# Patient Record
Sex: Female | Born: 1970 | Race: White | Hispanic: No | Marital: Married | Smoking: Never smoker
Health system: Southern US, Community
[De-identification: ages and names within clinical notes are randomized; demographics above are authoritative.]

## PROBLEM LIST (undated history)

## (undated) DIAGNOSIS — T7840XA Allergy, unspecified, initial encounter: Secondary | ICD-10-CM

## (undated) DIAGNOSIS — I10 Essential (primary) hypertension: Secondary | ICD-10-CM

## (undated) DIAGNOSIS — K5792 Diverticulitis of intestine, part unspecified, without perforation or abscess without bleeding: Secondary | ICD-10-CM

## (undated) DIAGNOSIS — J45909 Unspecified asthma, uncomplicated: Secondary | ICD-10-CM

## (undated) DIAGNOSIS — E079 Disorder of thyroid, unspecified: Secondary | ICD-10-CM

## (undated) DIAGNOSIS — Z9289 Personal history of other medical treatment: Secondary | ICD-10-CM

## (undated) DIAGNOSIS — Z86718 Personal history of other venous thrombosis and embolism: Secondary | ICD-10-CM

## (undated) HISTORY — DX: Allergy, unspecified, initial encounter: T78.40XA

## (undated) HISTORY — DX: Diverticulitis of intestine, part unspecified, without perforation or abscess without bleeding: K57.92

## (undated) HISTORY — DX: Personal history of other medical treatment: Z92.89

## (undated) HISTORY — DX: Unspecified asthma, uncomplicated: J45.909

## (undated) HISTORY — DX: Disorder of thyroid, unspecified: E07.9

## (undated) HISTORY — DX: Essential (primary) hypertension: I10

## (undated) HISTORY — DX: Personal history of other venous thrombosis and embolism: Z86.718

---

## 2000-11-26 DIAGNOSIS — Z86718 Personal history of other venous thrombosis and embolism: Secondary | ICD-10-CM | POA: Insufficient documentation

## 2000-11-26 HISTORY — DX: Personal history of other venous thrombosis and embolism: Z86.718

## 2015-12-13 DIAGNOSIS — R5383 Other fatigue: Secondary | ICD-10-CM

## 2015-12-13 DIAGNOSIS — J0110 Acute frontal sinusitis, unspecified: Secondary | ICD-10-CM | POA: Insufficient documentation

## 2015-12-13 DIAGNOSIS — R509 Fever, unspecified: Secondary | ICD-10-CM | POA: Insufficient documentation

## 2015-12-13 DIAGNOSIS — R059 Cough, unspecified: Secondary | ICD-10-CM

## 2015-12-13 HISTORY — DX: Other fatigue: R53.83

## 2015-12-13 HISTORY — DX: Cough, unspecified: R05.9

## 2018-07-24 ENCOUNTER — Telehealth: Payer: Self-pay

## 2018-07-24 NOTE — Telephone Encounter (Signed)
Copied from CRM [phone]. Topic: Appointment Scheduling - New Patient >> Jul 24, 2018 11:46 AM Arlyss Gandyichardson, Taren N, NT wrote: New patient has been scheduled for your office. Provider: Dr. Carmelia RollerWendling Date of Appointment: 09/01/18  Route to department's PEC pool.

## 2018-09-01 ENCOUNTER — Ambulatory Visit: Payer: BC Managed Care – PPO | Admitting: Family Medicine

## 2018-09-01 ENCOUNTER — Encounter: Payer: Self-pay | Admitting: Family Medicine

## 2018-09-01 VITALS — BP 134/88 | HR 73 | Temp 98.4°F | Ht 64.0 in | Wt 119.0 lb

## 2018-09-01 DIAGNOSIS — J0100 Acute maxillary sinusitis, unspecified: Secondary | ICD-10-CM | POA: Diagnosis not present

## 2018-09-01 DIAGNOSIS — I10 Essential (primary) hypertension: Secondary | ICD-10-CM | POA: Insufficient documentation

## 2018-09-01 DIAGNOSIS — K769 Liver disease, unspecified: Secondary | ICD-10-CM

## 2018-09-01 DIAGNOSIS — Z86711 Personal history of pulmonary embolism: Secondary | ICD-10-CM

## 2018-09-01 HISTORY — DX: Personal history of pulmonary embolism: Z86.711

## 2018-09-01 HISTORY — DX: Liver disease, unspecified: K76.9

## 2018-09-01 MED ORDER — DOXYCYCLINE HYCLATE 100 MG PO TABS: 100.0000 mg | ORAL_TABLET | Freq: Two times a day (BID) | ORAL | 0 refills | Status: AC

## 2018-09-01 MED ORDER — AMLODIPINE BESYLATE 5 MG PO TABS: 5.0000 mg | ORAL_TABLET | Freq: Every day | ORAL | 3 refills | Status: DC

## 2018-09-01 NOTE — Progress Notes (Signed)
Chief Complaint  Patient presents with  . New Patient (Initial Visit)       New Patient Visit SUBJECTIVE: HPI: Valerie Walker is an 47 y.o.female who is being seen for establishing care.  The patient was previously seen at [facility].   Blood Pressure Patient presents for eval of high BP. Never officially dx'd.  She does monitor home blood pressures. Blood pressures ranging on average from 140-160's/90's. She is not currently on any medication. +famhx of HTN. Feels that she is having HA's and blurred vision that she associated with high BP readings at home. Historically her BP has been in normal range. She is not adhering to a healthy diet. Exercise: none currently  2002 had a PE. Unprovoked, work up neg. Does not take anti-coag or ASA. She is concerned. Heme cleared her from Coumadin.   Was found to have a liver lesion on RUQ in 04/2018. 6 mo f/u noted. She was not explained the results or the concerns and is very anxious. No follow up imaging has been scheduled yet.  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 Levaquin [Levofloxacin In D5w] Palpitations    palpitations and headaches  . Penicillins Anaphylaxis  . Erythromycin Hives    Severe rash and hives  . Aspirin Rash    Past Medical History:  Diagnosis Date  . Allergy   . Asthma   . Diverticulitis   . History of blood transfusion    Had after delivery of baby  . Hx of blood clots 2002   unprovoked  . Hypertension   . Thyroid disease    History reviewed. No pertinent surgical history.   Family History  Problem Relation Age of Onset  . Asthma Mother   . Hypertension Mother   . Kidney disease Mother   . Cancer  Father        prostate  . Hypertension Father   . Hypertension Brother   . Asthma Son   . Asthma Maternal Grandmother   . Heart disease Maternal Grandfather   . Heart attack Maternal Grandfather   . Heart attack Paternal Grandmother   . Heart disease Paternal Grandmother   . Alcohol abuse Paternal Grandfather   . Early death Paternal Grandfather        due to alcohol   Allergies  Allergen Reactions  . Levaquin [Levofloxacin In D5w] Palpitations    palpitations and headaches  . Penicillins Anaphylaxis  . Erythromycin Hives    Severe rash and hives  . Aspirin Rash    Current Outpatient Medications:  .  triamcinolone (KENALOG) 0.025 % cream, Apply 1 application topically 2 (two) times daily., Disp: , Rfl:  .  amLODipine (NORVASC) 5 MG tablet, Take 1 tablet (5 mg total) by mouth daily., Disp: 30 tablet, Rfl: 3 .  aspirin 81 MG tablet, Take 1 tablet (81 mg total) by mouth daily., Disp: 30 tablet, Rfl:  .  doxycycline (VIBRA-TABS) 100 MG tablet, Take 1 tablet (100 mg total) by mouth 2 (two) times daily for 7 days., Disp: 14 tablet, Rfl: 0  ROS 10 pt ROS neg unless otherwise stated in HPI  OBJECTIVE: BP 134/88 (BP Location: Left Arm, Patient Position: Sitting, Cuff Size: Normal)   Pulse 73  Temp 98.4 F (36.9 C) (Oral)   Ht 5\' 4"  (1.626 m)   Wt 119 lb (54 kg)   SpO2 97%   BMI 20.43 kg/m   Constitutional: -  VS reviewed -  Well developed, well nourished, appears stated age -  No apparent distress  Psychiatric: -  Oriented to person, place, and time -  Memory intact -  Affect and mood normal -  Fluent conversation, good eye contact -  Judgment and insight age appropriate  Eye: -  Conjunctivae clear, no discharge -  Pupils symmetric, round, reactive to light  ENMT: -  MMM    Pharynx moist, no exudate, no erythema -  Ears neg -  Nares patent, +max sinus ttp on R  Neck: -  No gross swelling, no palpable masses -  Thyroid midline, not enlarged, mobile, no palpable  masses  Cardiovascular: -  RRR -  No bruits -  No LE edema  Respiratory: -  Normal respiratory effort, no accessory muscle use, no retraction -  Breath sounds equal, no wheezes, no ronchi, no crackles  Gastrointestinal: -  Bowel sounds normal -  No tenderness, no distention, no guarding, no masses  Neurological:  -  CN II - XII grossly intact -  Sensation grossly intact to light touch, equal bilaterally  Musculoskeletal: -  No clubbing, no cyanosis -  Gait normal  Skin: -  No significant lesion on inspection -  Warm and dry to palpation   ASSESSMENT/PLAN: Essential hypertension - Plan: amLODipine (NORVASC) 5 MG tablet  Liver lesion - Plan: US LIVER DOPPLER  Acute maxillary sinusitis, recurrence not specified - Plan: doxycycline (VIBRA-TABS) 100 MG tablet  History of pulmonary embolus (PE) - Plan: aspirin 81 MG tablet  Fortunately, I am able to see her prior records through [facility]. For BP, cont monitoring at home. Counseled on diet and exercise. Start Norvasc. Given famhx, do not suspect 2ndary cause at this time. For liver lesion, will repeat US in 3 mo. Given lack of blood flow, lower concern for malignancy, which I voiced to pt. This had never been discussed with her and she is reassured and agrees to [place] rather than MRI. Pocket rx given for sinusitis. Supportive care and instructions for when to use abx discussed and written. W hx of unprovoked DVT, OK to stay off of anticoag, but recent chest guidelines suggest ASA is next option, so will request her to start taking that. Questionable hx of rash with this, she is to let us know if this happens again. Patient should return in 1 mo to recheck BP. The patient voiced understanding and agreement to the plan.   Jilda Roche [place], DO 09/02/18  12:33 PM

## 2018-09-01 NOTE — Progress Notes (Signed)
Pre visit review using our clinic review tool, if applicable. No additional management support is needed unless otherwise documented below in the visit note. 

## 2018-09-01 NOTE — Patient Instructions (Addendum)
Keep checking blood pressures. Bring your monitor to your next appointment.  Continue to push fluids, practice good hand hygiene, and cover your mouth if you cough.  If you start having fevers, shaking or shortness of breath, seek immediate care.  Keep active.  Start taking a baby aspirin daily.  Most sinus infections are viral in etiology and antibiotics will not be helpful. That being said, if you start having worsening symptoms over 3 days, you are worsening by day 10 or not improving by day 14, go ahead and take it. You are on Day 4 as of now.   DASH Eating Plan DASH stands for "Dietary Approaches to Stop Hypertension." The DASH eating plan is a healthy eating plan that has been shown to reduce high blood pressure (hypertension). It may also reduce your risk for type 2 diabetes, heart disease, and stroke. The DASH eating plan may also help with weight loss. What are tips for following this plan? General guidelines  Avoid eating more than 2,300 mg (milligrams) of salt (sodium) a day. If you have hypertension, you may need to reduce your sodium intake to 1,500 mg a day.  Limit alcohol intake to no more than 1 drink a day for nonpregnant women and 2 drinks a day for men. One drink equals 12 oz of beer, 5 oz of wine, or 1 oz of hard liquor.  Work with your health care provider to maintain a healthy body weight or to lose weight. Ask what an ideal weight is for you.  Get at least 30 minutes of exercise that causes your heart to beat faster (aerobic exercise) most days of the week. Activities may include walking, swimming, or biking.  Work with your health care provider or diet and nutrition specialist (dietitian) to adjust your eating plan to your individual calorie needs. Reading food labels  Check food labels for the amount of sodium per serving. Choose foods with less than 5 percent of the Daily Value of sodium. Generally, foods with less than 300 mg of sodium per serving fit into this  eating plan.  To find whole grains, look for the word "whole" as the first word in the ingredient list. Shopping  Buy products labeled as "low-sodium" or "no salt added."  Buy fresh foods. Avoid canned foods and premade or frozen meals. Cooking  Avoid adding salt when cooking. Use salt-free seasonings or herbs instead of table salt or sea salt. Check with your health care provider or pharmacist before using salt substitutes.  Do not fry foods. Cook foods using healthy methods such as baking, boiling, grilling, and broiling instead.  Cook with heart-healthy oils, such as olive, canola, soybean, or sunflower oil. Meal planning   Eat a balanced diet that includes: ? 5 or more servings of fruits and vegetables each day. At each meal, try to fill half of your plate with fruits and vegetables. ? Up to 6-8 servings of whole grains each day. ? Less than 6 oz of lean meat, poultry, or fish each day. A 3-oz serving of meat is about the same size as a deck of cards. One egg equals 1 oz. ? 2 servings of low-fat dairy each day. ? A serving of nuts, seeds, or beans 5 times each week. ? Heart-healthy fats. Healthy fats called Omega-3 fatty acids are found in foods such as flaxseeds and coldwater fish, like sardines, salmon, and mackerel.  Limit how much you eat of the following: ? Canned or prepackaged foods. ? Food that is  high in trans fat, such as fried foods. ? Food that is high in saturated fat, such as fatty meat. ? Sweets, desserts, sugary drinks, and other foods with added sugar. ? Full-fat dairy products.  Do not salt foods before eating.  Try to eat at least 2 vegetarian meals each week.  Eat more home-cooked food and less restaurant, buffet, and fast food.  When eating at a restaurant, ask that your food be prepared with less salt or no salt, if possible. What foods are recommended? The items listed may not be a complete list. Talk with your dietitian about what dietary choices  are best for you. Grains Whole-grain or whole-wheat bread. Whole-grain or whole-wheat pasta. Brown rice. Orpah Cobb. Bulgur. Whole-grain and low-sodium cereals. Pita bread. Low-fat, low-sodium crackers. Whole-wheat flour tortillas. Vegetables Fresh or frozen vegetables (raw, steamed, roasted, or grilled). Low-sodium or reduced-sodium tomato and vegetable juice. Low-sodium or reduced-sodium tomato sauce and tomato paste. Low-sodium or reduced-sodium canned vegetables. Fruits All fresh, dried, or frozen fruit. Canned fruit in natural juice (without added sugar). Meat and other protein foods Skinless chicken or [place]. Ground chicken or [place]. Pork with fat trimmed off. Fish and seafood. Egg whites. Dried beans, peas, or lentils. Unsalted nuts, nut butters, and seeds. Unsalted canned beans. Lean cuts of beef with fat trimmed off. Low-sodium, lean deli meat. Dairy Low-fat (1%) or fat-free (skim) milk. Fat-free, low-fat, or reduced-fat cheeses. Nonfat, low-sodium ricotta or cottage cheese. Low-fat or nonfat yogurt. Low-fat, low-sodium cheese. Fats and oils Soft margarine without trans fats. Vegetable oil. Low-fat, reduced-fat, or light mayonnaise and salad dressings (reduced-sodium). Canola, safflower, olive, soybean, and sunflower oils. Avocado. Seasoning and other foods Herbs. Spices. Seasoning mixes without salt. Unsalted popcorn and pretzels. Fat-free sweets. What foods are not recommended? The items listed may not be a complete list. Talk with your dietitian about what dietary choices are best for you. Grains Baked goods made with fat, such as croissants, muffins, or some breads. Dry pasta or rice meal packs. Vegetables Creamed or fried vegetables. Vegetables in a cheese sauce. Regular canned vegetables (not low-sodium or reduced-sodium). Regular canned tomato sauce and paste (not low-sodium or reduced-sodium). Regular tomato and vegetable juice (not low-sodium or reduced-sodium). Rosita Fire.  Olives. Fruits Canned fruit in a light or heavy syrup. Fried fruit. Fruit in cream or butter sauce. Meat and other protein foods Fatty cuts of meat. Ribs. Fried meat. Tomasa Blase. Sausage. Bologna and other processed lunch meats. Salami. Fatback. Hotdogs. Bratwurst. Salted nuts and seeds. Canned beans with added salt. Canned or smoked fish. Whole eggs or egg yolks. Chicken or [place] with skin. Dairy Whole or 2% milk, cream, and half-and-half. Whole or full-fat cream cheese. Whole-fat or sweetened yogurt. Full-fat cheese. Nondairy creamers. Whipped toppings. Processed cheese and cheese spreads. Fats and oils Butter. Stick margarine. Lard. Shortening. Ghee. Bacon fat. Tropical oils, such as coconut, palm kernel, or palm oil. Seasoning and other foods Salted popcorn and pretzels. Onion salt, garlic salt, seasoned salt, table salt, and sea salt. Worcestershire sauce. Tartar sauce. Barbecue sauce. Teriyaki sauce. Soy sauce, including reduced-sodium. Steak sauce. Canned and packaged gravies. Fish sauce. Oyster sauce. Cocktail sauce. Horseradish that you find on the shelf. Ketchup. Mustard. Meat flavorings and tenderizers. Bouillon cubes. Hot sauce and Tabasco sauce. Premade or packaged marinades. Premade or packaged taco seasonings. Relishes. Regular salad dressings. Where to find more information:  National Heart, Lung, and Blood Institute: PopSteam.is  American Heart Association: www.heart.org Summary  The DASH eating plan is a healthy eating  plan that has been shown to reduce high blood pressure (hypertension). It may also reduce your risk for type 2 diabetes, heart disease, and stroke.  With the DASH eating plan, you should limit salt (sodium) intake to 2,300 mg a day. If you have hypertension, you may need to reduce your sodium intake to 1,500 mg a day.  When on the DASH eating plan, aim to eat more fresh fruits and vegetables, whole grains, lean proteins, low-fat dairy, and heart-healthy  fats.  Work with your health care provider or diet and nutrition specialist (dietitian) to adjust your eating plan to your individual calorie needs. This information is not intended to replace advice given to you by your health care provider. Make sure you discuss any questions you have with your health care provider. Document Released: 11/01/2011 Document Revised: 11/05/2016 Document Reviewed: 11/05/2016 Elsevier Interactive Patient Education  [employer].

## 2018-10-01 ENCOUNTER — Ambulatory Visit: Payer: BC Managed Care – PPO | Admitting: Family Medicine

## 2018-11-11 ENCOUNTER — Encounter: Payer: Self-pay | Admitting: Family Medicine

## 2018-11-11 ENCOUNTER — Ambulatory Visit: Payer: BC Managed Care – PPO | Admitting: Family Medicine

## 2018-11-11 VITALS — BP 130/86 | HR 77 | Temp 98.2°F | Ht 64.0 in | Wt 120.4 lb

## 2018-11-11 DIAGNOSIS — Z23 Encounter for immunization: Secondary | ICD-10-CM | POA: Diagnosis not present

## 2018-11-11 DIAGNOSIS — I10 Essential (primary) hypertension: Secondary | ICD-10-CM | POA: Diagnosis not present

## 2018-11-11 DIAGNOSIS — Z114 Encounter for screening for human immunodeficiency virus [HIV]: Secondary | ICD-10-CM

## 2018-11-11 NOTE — Progress Notes (Signed)
Chief Complaint  Patient presents with  . Hypertension    Subjective Valerie Walker is a 47 y.o. female who presents for hypertension follow up. She does monitor home blood pressures. Blood pressures ranging from 140's/90's on average. She is compliant with medication- recently started on amlodipine 5 mg/d. Patient has these side effects of medication: none She is adhering to a healthy diet overall. Current exercise: walking, physically active at work   Past Medical History:  Diagnosis Date  . Allergy   . Asthma   . Diverticulitis   . History of blood transfusion    Had after delivery of baby  . Hx of blood clots 2002   unprovoked  . Hypertension   . Thyroid disease     Review of Systems Cardiovascular: no chest pain Respiratory:  no shortness of breath  Exam BP 130/86 (BP Location: Left Arm, Patient Position: Sitting, Cuff Size: Normal)   Pulse 77   Temp 98.2 F (36.8 C) (Oral)   Ht 5\' 4"  (1.626 m)   Wt 120 lb 6 oz (54.6 kg)   SpO2 98%   BMI 20.66 kg/m  General:  well developed, well nourished, in no apparent distress Heart: RRR, no bruits, no LE edema Lungs: clear to auscultation, no accessory muscle use Psych: well oriented with normal range of affect and appropriate judgment/insight  Hypertension, unspecified type - Plan: TSH, Comprehensive metabolic panel, CBC, Aldosterone + renin activity w/ ratio  Screening for HIV (human immunodeficiency virus) - Plan: HIV Antibody (routine testing w rflx)  Orders as above. Ck labs. Will hold off on adjusting dose until we can verify home readings.  Counseled on diet and exercise. Cut down on sweets.  F/u in 1 mo. The patient voiced understanding and agreement to the plan.  Jilda Rocheicholas Paul Westlake VillageWend[place], DO 11/11/18  4:30 PM

## 2018-11-11 NOTE — Patient Instructions (Addendum)
Keep checking blood pressures at home.   Keep the diet clean and stay active.  Give us 4-5 business days to get the results of your labs back.   Let's keep the medicine the same dose for now. Remember to bring your blood pressure monitor to your next appointment.   Let us know if you need anything.

## 2018-11-11 NOTE — Addendum Note (Signed)
Addended by: Scharlene GlossEWING, ROBIN B on: 11/11/2018 04:37 PM   Modules accepted: Orders

## 2018-11-11 NOTE — Progress Notes (Signed)
Pre visit review using our clinic review tool, if applicable. No additional management support is needed unless otherwise documented below in the visit note. 

## 2018-11-12 LAB — COMPREHENSIVE METABOLIC PANEL
ALBUMIN: 4.3 g/dL (ref 3.5–5.2)
ALK PHOS: 71 U/L (ref 39–117)
ALT: 15 U/L (ref 0–35)
AST: 16 U/L (ref 0–37)
BILIRUBIN TOTAL: 0.3 mg/dL (ref 0.2–1.2)
BUN: 11 mg/dL (ref 6–23)
CALCIUM: 9.6 mg/dL (ref 8.4–10.5)
CO2: 29 mEq/L (ref 19–32)
Chloride: 101 mEq/L (ref 96–112)
Creatinine, Ser: 0.73 mg/dL (ref 0.40–1.20)
GFR: 90.87 mL/min (ref >60.00)
Glucose, Bld: 99 mg/dL (ref 70–99)
Potassium: 4.7 mEq/L (ref 3.5–5.1)
Sodium: 137 mEq/L (ref 135–145)
TOTAL PROTEIN: 7.1 g/dL (ref 6.0–8.3)

## 2018-11-12 LAB — CBC
HCT: 41.2 % (ref 36.0–46.0)
Hemoglobin: 13.6 g/dL (ref 12.0–15.0)
MCHC: 33 g/dL (ref 30.0–36.0)
MCV: 90.5 fl (ref 78.0–100.0)
PLATELETS: 392 10*3/uL (ref 150.0–400.0)
RBC: 4.56 Mil/uL (ref 3.87–5.11)
RDW: 12.9 % (ref 11.5–15.5)
WBC: 10 10*3/uL (ref 4.0–10.5)

## 2018-11-12 LAB — TSH: TSH: 0.64 u[IU]/mL (ref 0.35–4.50)

## 2018-11-13 ENCOUNTER — Telehealth: Payer: Self-pay

## 2018-11-13 DIAGNOSIS — K769 Liver disease, unspecified: Secondary | ICD-10-CM

## 2018-11-13 NOTE — Telephone Encounter (Signed)
"  2. 2 well-defined hyperechoic lesions within the right lobe of the liver. These most likely represent hemangiomas. Follow-up ultrasound or MRI 6 months would be useful for further evaluation." Because hemangiomas were possibly involved I thought doppler, but I definitely want to evaluate lesions so change if necessary. TY.

## 2018-11-13 NOTE — Telephone Encounter (Signed)
[facility] calling to clarify order for US liver per Dr. Carmelia RollerWendling, scheduled for tomorrow.  Imaging questioning need for a doppler of the lesion, or just to evaluate liver lesion itself. If latter, RUQ U/S is more appropriate. Routed to Dr .Carmelia RollerWendling as high priority to address. Need to call #[phone], "Peggy", by end of the day to clarify.

## 2018-11-14 ENCOUNTER — Ambulatory Visit (HOSPITAL_COMMUNITY)
Admission: RE | Admit: 2018-11-14 | Discharge: 2018-11-14 | Disposition: A | Discharge status: Home / Self Care | Payer: BC Managed Care – PPO | Source: Ambulatory Visit | Attending: Family Medicine | Admitting: Family Medicine

## 2018-11-14 DIAGNOSIS — K769 Liver disease, unspecified: Secondary | ICD-10-CM | POA: Insufficient documentation

## 2018-11-17 LAB — HIV ANTIBODY (ROUTINE TESTING W REFLEX): HIV: NONREACTIVE

## 2018-11-17 LAB — ALDOSTERONE + RENIN ACTIVITY W/ RATIO
ALDO / PRA Ratio: 1.6 Ratio (ref 0.9–28.9)
ALDOSTERONE: 2 ng/dL
Renin Activity: 1.25 ng/mL/h (ref 0.25–5.82)

## 2018-12-10 ENCOUNTER — Ambulatory Visit (INDEPENDENT_AMBULATORY_CARE_PROVIDER_SITE_OTHER): Payer: BC Managed Care – PPO | Admitting: Family Medicine

## 2018-12-10 DIAGNOSIS — I10 Essential (primary) hypertension: Secondary | ICD-10-CM | POA: Diagnosis not present

## 2018-12-10 MED ORDER — AMLODIPINE BESYLATE 10 MG PO TABS: 10.0000 mg | ORAL_TABLET | Freq: Every day | ORAL | 3 refills | Status: DC

## 2018-12-10 NOTE — Progress Notes (Signed)
Pre visit review using our clinic tool,if applicable. No additional management support is needed unless otherwise documented below in the visit note.   Pt here for Blood pressure check per order from Dr. Zipporah Plants.  Pt currently takes:Amlodipine 5 mg daily.  Home monitor readings have been as follows: 128/91,156/93,129/85.  Pt reports compliance with medication.  BP today  = 154/90 HR =70  Home Monitor BP= 165/110  P= 72  Pt advised per Dr. Carmelia Roller increase Amlodipine to 10 mg daily and return in 2 weeks for BP check. Appointment scheduled.

## 2018-12-10 NOTE — Progress Notes (Signed)
Noted. Agree with above.  Jadrien Narine Paul Derryl Uher, DO 12/10/18 5:01 PM   

## 2018-12-17 ENCOUNTER — Encounter: Payer: Self-pay | Admitting: Family Medicine

## 2018-12-17 ENCOUNTER — Ambulatory Visit: Payer: BC Managed Care – PPO | Admitting: Family Medicine

## 2018-12-17 VITALS — BP 122/82 | HR 79 | Temp 98.5°F | Ht 64.0 in | Wt 119.4 lb

## 2018-12-17 DIAGNOSIS — H6593 Unspecified nonsuppurative otitis media, bilateral: Secondary | ICD-10-CM

## 2018-12-17 DIAGNOSIS — B9789 Other viral agents as the cause of diseases classified elsewhere: Secondary | ICD-10-CM

## 2018-12-17 DIAGNOSIS — J069 Acute upper respiratory infection, unspecified: Secondary | ICD-10-CM

## 2018-12-17 MED ORDER — METHYLPREDNISOLONE ACETATE 80 MG/ML IJ SUSP
80.0000 mg | Freq: Once | INTRAMUSCULAR | Status: AC
  Administered 2018-12-17: 80 mg via INTRAMUSCULAR

## 2018-12-17 NOTE — Patient Instructions (Addendum)
Continue to push fluids, practice good hand hygiene, and cover your mouth if you cough.  If you start having fevers, shaking or shortness of breath, seek immediate care.  For symptoms, consider using Vick's VapoRub on chest or under nose, air humidifier, Benadryl at night, and elevating the head of the bed. Tylenol and ibuprofen for aches and pains you may be experiencing.   Mucinex (guanfesin) otc can help with breaking up the secretions.  Let us know if you need anything.

## 2018-12-17 NOTE — Addendum Note (Signed)
Addended by: Scharlene Gloss B on: 12/17/2018 03:35 PM   Modules accepted: Orders

## 2018-12-17 NOTE — Progress Notes (Signed)
Chief Complaint  Patient presents with  . Cough    congestion    Estill Cotta here for URI complaints.  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 Allergy   . Asthma   . Diverticulitis   . History of blood transfusion    Had after delivery of baby  . Hx of blood clots 2002   unprovoked  . Hypertension   . Thyroid disease     BP 122/82 (BP Location: Left Arm, Patient Position: Sitting, Cuff Size: Normal)   Pulse 79   Temp 98.5 F (36.9 C) (Oral)   Ht 5\' 4"  (1.626 m)   Wt 119 lb 6 oz (54.1 kg)   SpO2 98%   BMI 20.49 kg/m  General: Awake, alert, appears stated age HEENT: AT, [place], ears patent b/l and TM on R neg, L TM with serous fluid without erythema, nares patent w/o discharge, pharynx pink and without exudates, MMM Neck: No masses or asymmetry Heart: RRR Lungs: CTAB, no accessory muscle use Psych: Age appropriate judgment and insight, normal mood and affect  Viral URI with cough  Middle ear effusion, bilateral  Mucinex to help break things up.  Continue to push fluids, practice good hand hygiene, cover mouth when coughing. F/u prn. If starting to experience fevers, shaking, or shortness of breath, seek immediate care. Pt voiced understanding and agreement to the plan.  Jilda Roche [place], DO 12/17/18 3:28 PM

## 2018-12-19 ENCOUNTER — Encounter: Payer: Self-pay | Admitting: Family Medicine

## 2018-12-23 ENCOUNTER — Other Ambulatory Visit: Payer: Self-pay | Admitting: Family Medicine

## 2018-12-23 MED ORDER — AMLODIPINE BESYLATE 5 MG PO TABS: 5.0000 mg | ORAL_TABLET | Freq: Every day | ORAL | 1 refills | Status: DC

## 2018-12-23 NOTE — Progress Notes (Signed)
Changed Norvasc from 10 mg/d to 5 mg/d. Pt notified via [facility].

## 2018-12-24 ENCOUNTER — Ambulatory Visit: Payer: BC Managed Care – PPO

## 2018-12-24 ENCOUNTER — Telehealth: Payer: Self-pay

## 2018-12-24 NOTE — Telephone Encounter (Signed)
Pt scheduled for BP check today- however PCP changed amlodipine back to 5mg  daily on 12/19/2018. Per PCP- cancel nurse visit- reschedule for 4 weeks. LMOM informing Pt of this information and to call and reschedule nurse visit. BP check for today cancelled.

## 2019-01-19 ENCOUNTER — Ambulatory Visit: Payer: BC Managed Care – PPO | Admitting: Family Medicine

## 2019-01-19 ENCOUNTER — Encounter: Payer: Self-pay | Admitting: Family Medicine

## 2019-01-19 VITALS — BP 122/80 | HR 105 | Temp 98.9°F | Ht 64.5 in | Wt 120.0 lb

## 2019-01-19 DIAGNOSIS — J0100 Acute maxillary sinusitis, unspecified: Secondary | ICD-10-CM | POA: Diagnosis not present

## 2019-01-19 DIAGNOSIS — M7918 Myalgia, other site: Secondary | ICD-10-CM

## 2019-01-19 MED ORDER — METHYLPREDNISOLONE ACETATE 80 MG/ML IJ SUSP
80.0000 mg | Freq: Once | INTRAMUSCULAR | Status: AC
  Administered 2019-01-19: 80 mg via INTRAMUSCULAR

## 2019-01-19 MED ORDER — DOXYCYCLINE HYCLATE 100 MG PO TABS: 100.0000 mg | ORAL_TABLET | Freq: Two times a day (BID) | ORAL | 0 refills | Status: DC

## 2019-01-19 MED ORDER — AMLODIPINE BESYLATE 5 MG PO TABS: 5.0000 mg | ORAL_TABLET | Freq: Every day | ORAL | 1 refills | Status: DC

## 2019-01-19 NOTE — Progress Notes (Signed)
Chief Complaint  Patient presents with  . Cough    congestion  . Fever    Valerie Walker here for URI complaints.  Duration: 4 days  Associated symptoms: fevers (101F), sinus congestion, sinus pain, rhinorrhea, ear pain, wheezing, chest tightness and cough Denies: itchy watery eyes, ear drainage, sore throat, shortness of breath and myalgia Treatment to date: Mucinex Sick contacts: Yes- at school  Has issue on R buttock from many years ago. Starting to get more painful, notices when she stands for periods of time. No inj or recent change in activity. Has not tried anything.   ROS:  Const: + fevers HEENT: As noted in HPI Lungs: No SOB  Past Medical History:  Diagnosis Date  . Allergy   . Asthma   . Diverticulitis   . History of blood transfusion    Had after delivery of baby  . Hx of blood clots 2002   unprovoked  . Hypertension   . Thyroid disease     BP 122/80 (BP Location: Left Arm, Patient Position: Sitting, Cuff Size: Normal)   Pulse (!) 105   Temp 98.9 F (37.2 C) (Oral)   Ht 5' 4.5" (1.638 m)   Wt 120 lb (54.4 kg)   SpO2 98%   BMI 20.28 kg/m  General: Awake, alert, appears stated age HEENT: AT, [place], ears patent b/l and TM's neg, nares patent w/o discharge, +ttp over max sinuses b/l, pharynx pink and without exudates, MMM Neck: No masses or asymmetry Heart: RRR Lungs: CTAB, no accessory muscle use MSK: +ttp over cephalad portion of R glute area, there is a depression of hypopigmentation over the area. No fluctuance, excessive warmth, erythema Psych: Age appropriate judgment and insight, normal mood and affect  Acute maxillary sinusitis, recurrence not specified - Plan: doxycycline (VIBRA-TABS) 100 MG tablet  Buttock pain - Plan: Ambulatory referral to Sports Medicine  IM Depo today. If no improvement or fevers return, take above abx.  Continue to push fluids, practice good hand hygiene, cover mouth when coughing. Refer to [facility] for possible [place] of above.  Stretches/exercises provided.  F/u prn. If starting to experience fevers, shaking, or shortness of breath, seek immediate care. Pt voiced understanding and agreement to the plan.  Jilda Roche [place], DO 01/19/19 4:03 PM

## 2019-01-19 NOTE — Addendum Note (Signed)
Addended by: Scharlene Gloss B on: 01/19/2019 04:13 PM   Modules accepted: Orders

## 2019-01-19 NOTE — Patient Instructions (Addendum)
Continue to push fluids, practice good hand hygiene, and cover your mouth if you cough.  If you start having fevers, shaking or shortness of breath, seek immediate care.  If you develop fevers again, take antibiotic. If you continue to improve, you do not need to take.  If you do not hear anything about your referral in the next 1-2 weeks, call our office and ask for an update.  Let us know if you need anything.   Hip Exercises It is normal to feel mild stretching, pulling, tightness, or discomfort as you do these exercises, but you should stop right away if you feel sudden pain or your pain gets worse.  STRETCHING AND RANGE OF MOTION EXERCISES These exercises warm up your muscles and joints and improve the movement and flexibility of your hip. These exercises also help to relieve pain, numbness, and tingling. Exercise A: Hamstrings, Supine  1. Lie on your back. 2. Loop a belt or towel over the ball of your left / rightfoot. The ball of your foot is on the walking surface, right under your toes. 3. Straighten your left / rightknee and slowly pull on the belt to raise your leg. ? Do not let your left / right knee bend while you do this. ? Keep your other leg flat on the floor. ? Raise the left / right leg until you feel a gentle stretch behind your left / right knee or thigh. 4. Hold this position for 30 seconds. 5. Slowly return your leg to the starting position. Repeat2 times. Complete this stretch 3 times per week. Exercise B: Hip Rotators  1. Lie on your back on a firm surface. 2. Hold your left / right knee with your left / right hand. Hold your ankle with your other hand. 3. Gently pull your left / right knee and rotate your lower leg toward your other shoulder. ? Pull until you feel a stretch in your buttocks. ? Keep your hips and shoulders firmly planted while you do this stretch. 4. Hold this position for 30 seconds. Repeat 2 times. Complete this stretch 3 times per  week. Exercise C: V-Sit (Hamstrings and Adductors)  1. Sit on the floor with your legs extended in a large "V" shape. Keep your knees straight during this exercise. 2. Start with your head and chest upright, then bend at your waist to reach for your left foot (position A). You should feel a stretch in your right inner thigh. 3. Hold this position for 30 seconds. Then slowly return to the upright position. 4. Bend at your waist to reach forward (position B). You should feel a stretch behind both of your thighs and knees. 5. Hold this position for 30 seconds. Then slowly return to the upright position. 6. Bend at your waist to reach for your right foot (position C). You should feel a stretch in your left inner thigh. 7. Hold this position for 30 seconds. Then slowly return to the upright position. Repeat A, B, and C 2 times each. Complete this stretch 3 times per week. Exercise D: Lunge (Hip Flexors)  1. Place your left / right knee on the floor and bend your other knee so that is directly over your ankle. You should be half-kneeling. 2. Keep good posture with your head over your shoulders. 3. Tighten your buttocks to point your tailbone downward. This helps your back to keep from arching too much. 4. You should feel a gentle stretch in the front of your left /  right thigh and hip. If you do not feel any resistance, slightly slide your other foot forward and then slowly lunge forward so your knee once again lines up over your ankle. 5. Make sure your tailbone continues to point downward. 6. Hold this position for 30 seconds. Repeat 2 times. Complete this stretch 3 times per week.  STRENGTHENING EXERCISES These exercises build strength and endurance in your hip. Endurance is the ability to use your muscles for a long time, even after they get tired. Exercise E: Bridge (Hip Extensors)  1. Lie on your back on a firm surface with your knees bent and your feet flat on the floor. 2. Tighten your  buttocks muscles and lift your bottom off the floor until the trunk of your body is level with your thighs. ? Do not arch your back. ? You should feel the muscles working in your buttocks and the back of your thighs. If you do not feel these muscles, slide your feet 1-2 inches (2.5-5 cm) farther away from your buttocks. 3. Hold this position for 3 seconds. 4. Slowly lower your hips to the starting position. Repeat for a total of 10 repetitions. 5. Let your muscles relax completely between repetitions. 6. If this exercise is too easy, try doing it with your arms crossed over your chest. Repeat 2 times. Complete this exercise 3 times per week. Exercise F: Straight Leg Raises - Hip Abductors  1. Lie on your side with your left / right leg in the top position. Lie so your head, shoulder, knee, and hip line up with each other. You may bend your bottom knee to help you balance. 2. Roll your hips slightly forward, so your hips are stacked directly over each other and your left / right knee is facing forward. 3. Leading with your heel, lift your top leg 4-6 inches (10-15 cm). You should feel the muscles in your outer hip lifting. ? Do not let your foot drift forward. ? Do not let your knee roll toward the ceiling. 4. Hold this position for 1 second. 5. Slowly return to the starting position. 6. Let your muscles relax completely between repetitions. Repeat for a total of 10 repetitions.  Repeat 2 times. Complete this exercise 3 times per week. Exercise G: Straight Leg Raises - Hip Adductors  1. Lie on your side with your left / right leg in the bottom position. Lie so your head, shoulder, knee, and hip line up. You may place your upper foot in front to help you balance. 2. Roll your hips slightly forward, so your hips are stacked directly over each other and your left / right knee is facing forward. 3. Tense the muscles in your inner thigh and lift your bottom leg 4-6 inches (10-15 cm). 4. Hold this  position for 1 second. 5. Slowly return to the starting position. 6. Let your muscles relax completely between repetitions. Repeat for a total of 10 repetitions. Repeat 2 times. Complete this exercise 3 times per week. Exercise H: Straight Leg Raises - Quadriceps  1. Lie on your back with your left / right leg extended and your other knee bent. 2. Tense the muscles in the front of your left / right thigh. When you do this, you should see your kneecap slide up or see increased dimpling just above your knee. 3. Tighten these muscles even more and raise your leg 4-6 inches (10-15 cm) off the floor. 4. Hold this position for 3 seconds. 5. Keep these muscles  tense as you lower your leg. 6. Relax the muscles slowly and completely between repetitions. Repeat for a total of 10 repetitions. Repeat 2 times. Complete this exercise 3 times per week. Exercise I: Hip Abductors, Standing 1. Tie one end of a rubber exercise band or tubing to a secure surface, such as a table or pole. 2. Loop the other end of the band or tubing around your left / right ankle. 3. Keeping your ankle with the band or tubing directly opposite of the secured end, step away until there is tension in the tubing or band. Hold onto a chair as needed for balance. 4. Lift your left / right leg out to your side. While you do this: ? Keep your back upright. ? Keep your shoulders over your hips. ? Keep your toes pointing forward. ? Make sure to use your hip muscles to lift your leg. Do not "throw" your leg or tip your body to lift your leg. 5. Hold this position for 1 second. 6. Slowly return to the starting position. Repeat for a total of 10 repetitions. Repeat 2 times. Complete this exercise 3 times per week. Exercise J: Squats (Quadriceps) 1. Stand in a door frame so your feet and knees are in line with the frame. You may place your hands on the frame for balance. 2. Slowly bend your knees and lower your hips like you are going to  sit in a chair. ? Keep your lower legs in a straight-up-and-down position. ? Do not let your hips go lower than your knees. ? Do not bend your knees lower than told by your health care provider. ? If your hip pain increases, do not bend as low. 3. Hold this position for 1 second. 4. Slowly push with your legs to return to standing. Do not use your hands to pull yourself to standing. Repeat for a total of 10 repetitions. Repeat 2 times. Complete this exercise 3 times per week. Make sure you discuss any questions you have with your health care provider. Document Released: 11/30/2005 Document Revised: 08/06/2016 Document Reviewed: 11/07/2015 Elsevier Interactive Patient Education  [employer].

## 2019-08-19 ENCOUNTER — Other Ambulatory Visit: Payer: Self-pay

## 2019-08-19 ENCOUNTER — Encounter: Payer: Self-pay | Admitting: Family Medicine

## 2019-08-19 ENCOUNTER — Ambulatory Visit: Payer: BC Managed Care – PPO | Admitting: Family Medicine

## 2019-08-19 VITALS — BP 118/80 | HR 84 | Temp 98.2°F | Ht 64.0 in | Wt 129.5 lb

## 2019-08-19 DIAGNOSIS — M2550 Pain in unspecified joint: Secondary | ICD-10-CM | POA: Diagnosis not present

## 2019-08-19 DIAGNOSIS — R635 Abnormal weight gain: Secondary | ICD-10-CM

## 2019-08-19 NOTE — Progress Notes (Signed)
Chief Complaint  Patient presents with  . Joint Swelling    pain in joints    Subjective: Patient is a 48 y.o. female here for joint pain.  Started having pain in her joints around 2 months ago.  It affects mainly her hands, knees, and ankles bilaterally.  She is also having diffuse itching but no current rash.  Today, her pain and swelling is better than usual.  She denies any injury or inciting event.  No recent change in her medications.  She does not have a personal history of gout, but does have a family history of rheumatoid arthritis and lupus in some second-degree relatives.  She has been gaining weight as well.  Denies any dry mouth, neurologic s/s's, or vision changes.  ROS: Endo: +wt gain MSK: +jt pain  Past Medical History:  Diagnosis Date  . Allergy   . Asthma   . Diverticulitis   . History of blood transfusion    Had after delivery of baby  . Hx of blood clots 2002   unprovoked  . Hypertension   . Thyroid disease     Objective: BP 118/80 (BP Location: Left Arm, Patient Position: Sitting, Cuff Size: Normal)   Pulse 84   Temp 98.2 F (36.8 C) (Temporal)   Ht 5\' 4"  (1.626 m)   Wt 129 lb 8 oz (58.7 kg)   SpO2 97%   BMI 22.23 kg/m  General: Awake, appears stated age HEENT: MMM, EOMi Heart: RRR, no lower extremity edema MSK:+ttp over right PIP joint without edema; I do not appreciate any other areas of deformity, tenderness to palpation, or joint swelling including the knees or ankles Neuro: DTRs equal and symmetric in lower extremities, no cerebellar signs, no clonus Lungs: CTAB, no rales, wheezes or rhonchi. No accessory muscle use Psych: Age appropriate judgment and insight, normal affect and mood  Assessment and Plan: Arthralgia, unspecified joint - Plan: CBC, Uric acid, Vitamin D (25 hydroxy), Antinuclear Antib (ANA), Rheumatoid Factor, Centromere Antibodies, Anti-Smith antibody, Anti-DNA antibody, double-stranded, Aldolase, Cyclic citrul peptide antibody,  IgG (QUEST), Sjogrens syndrome-A extractable nuclear antibody  Weight gain - Plan: TSH  Orders as above.  Will rule out autoimmune etiologies.  If above is normal, will trial a prednisone taper.  If that is particularly helpful, will refer to rheumatology. Follow-up as needed regarding this.  I want to see her in person if she is having swollen joints.  We may be able to aspirate and send off for fluid analysis. The patient voiced understanding and agreement to the plan.  Hollins, DO 08/19/19  4:20 PM

## 2019-08-19 NOTE — Patient Instructions (Signed)
Consider taking ibuprofen.  OK to take Tylenol 1000 mg (2 extra strength tabs) or 975 mg (3 regular strength tabs) every 6 hours as needed.  Ice/cold pack over area for 10-15 min twice daily.  Give [place] 1-2 weeks to get the results of your labs back.   I want to see you if the swelling comes back.   Let us know if you need anything.

## 2019-08-20 LAB — URIC ACID: Uric Acid, Serum: 4.1 mg/dL (ref 2.4–7.0)

## 2019-08-20 LAB — CBC
HCT: 41.3 % (ref 36.0–46.0)
Hemoglobin: 13.7 g/dL (ref 12.0–15.0)
MCHC: 33.1 g/dL (ref 30.0–36.0)
MCV: 89.2 fl (ref 78.0–100.0)
Platelets: 383 10*3/uL (ref 150.0–400.0)
RBC: 4.63 Mil/uL (ref 3.87–5.11)
RDW: 12.7 % (ref 11.5–15.5)
WBC: 8.5 10*3/uL (ref 4.0–10.5)

## 2019-08-20 LAB — VITAMIN D 25 HYDROXY (VIT D DEFICIENCY, FRACTURES): VITD: 57.6 ng/mL (ref 30.00–100.00)

## 2019-08-20 LAB — TSH: TSH: 0.67 u[IU]/mL (ref 0.35–4.50)

## 2019-08-21 LAB — CENTROMERE ANTIBODIES: Centromere Ab Screen: <1 AI

## 2019-08-21 LAB — CYCLIC CITRUL PEPTIDE ANTIBODY, IGG: Cyclic Citrullin Peptide Ab: <16 UNITS

## 2019-08-21 LAB — RHEUMATOID FACTOR: Rheumatoid fact SerPl-aCnc: <14 IU/mL (ref <14)

## 2019-08-21 LAB — ALDOLASE: Aldolase: 5.2 U/L (ref <=8.1)

## 2019-08-21 LAB — ANTI-DNA ANTIBODY, DOUBLE-STRANDED: ds DNA Ab: 1 IU/mL

## 2019-08-21 LAB — SJOGRENS SYNDROME-A EXTRACTABLE NUCLEAR ANTIBODY: SSA (Ro) (ENA) Antibody, IgG: <1 AI

## 2019-08-21 LAB — ANA: Anti Nuclear Antibody (ANA): NEGATIVE

## 2019-08-21 LAB — ANTI-SMITH ANTIBODY: ENA SM Ab Ser-aCnc: <1 AI

## 2019-08-22 ENCOUNTER — Other Ambulatory Visit: Payer: Self-pay | Admitting: Family Medicine

## 2019-08-22 MED ORDER — PREDNISONE 10 MG PO TABS: ORAL_TABLET | ORAL | 0 refills | Status: AC

## 2019-12-01 ENCOUNTER — Other Ambulatory Visit: Payer: Self-pay | Admitting: Family Medicine

## 2020-06-05 ENCOUNTER — Other Ambulatory Visit: Payer: Self-pay | Admitting: Family Medicine

## 2020-07-28 ENCOUNTER — Telehealth (INDEPENDENT_AMBULATORY_CARE_PROVIDER_SITE_OTHER): Payer: BC Managed Care – PPO | Admitting: Family Medicine

## 2020-07-28 DIAGNOSIS — R0981 Nasal congestion: Secondary | ICD-10-CM

## 2020-07-28 DIAGNOSIS — J029 Acute pharyngitis, unspecified: Secondary | ICD-10-CM | POA: Diagnosis not present

## 2020-07-28 DIAGNOSIS — R5383 Other fatigue: Secondary | ICD-10-CM | POA: Diagnosis not present

## 2020-07-28 NOTE — Patient Instructions (Addendum)
  WORK SLIP:  Patient Valerie Walker,  12-24-1970, was seen for a medical visit today, 07/28/20. Please excuse from work according to the [employer] guidelines for a COVID like illness. We advise 10 days minimum from the onset of symptoms (07/27/20) PLUS 1 day of no fever and improved symptoms. Will defer to employer for a sooner return to work if COVID19 testing is negative and the symptoms have resolved. Advise following CDC guidelines.   Sincerely: E-signature: Dr. Kriste Basque, DO [facility] Ph: [phone]

## 2020-07-28 NOTE — Progress Notes (Signed)
Virtual Visit via Video Note  I connected with Valerie Walker  on 07/28/20 at  3:20 PM EDT by a video enabled telemedicine application and verified that I am speaking with the correct person using two identifiers.  Location patient: home, [place] Location provider:work or home office Persons participating in the virtual visit: patient, provider  I discussed the limitations of evaluation and management by telemedicine and the availability of in person appointments. The patient expressed understanding and agreed to proceed.   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  Past Medical History:  Diagnosis Date  . Allergy   . Asthma   . Diverticulitis   . History of blood transfusion    Had after delivery of baby  . Hx of blood clots 2002   unprovoked  . Hypertension   . Thyroid disease     No past surgical history on file.  Family History  Problem Relation Age of Onset  . Asthma Mother   . Hypertension Mother   . Kidney disease Mother   . Cancer Father        prostate  . Hypertension Father   . Hypertension Brother   . Asthma Son   . Asthma Maternal Grandmother   . Heart disease Maternal Grandfather   . Heart attack Maternal Grandfather   . Heart attack Paternal Grandmother   . Heart disease Paternal Grandmother   . Alcohol abuse Paternal Grandfather   . Early death Paternal Grandfather        due to alcohol    SOCIAL HX: see hpi   Current Outpatient Medications:  .   amLODipine (NORVASC) 5 MG tablet, TAKE 1 TABLET BY MOUTH EVERY DAY, Disp: 90 tablet, Rfl: 1 .  triamcinolone (KENALOG) 0.025 % cream, Apply 1 application topically 2 (two) times daily., Disp: , Rfl:   EXAM:  VITALS per patient if applicable:  GENERAL: alert, oriented, appears well and in no acute distress  HEENT: atraumatic, conjunttiva clear, no obvious abnormalities on inspection of external nose and ears  NECK: normal movements of the head and neck  LUNGS: on inspection no signs of respiratory distress, breathing rate appears normal, no obvious gross SOB, gasping or wheezing  CV: no obvious cyanosis  MS: moves all visible extremities without noticeable abnormality  PSYCH/NEURO: pleasant and cooperative, no obvious depression or anxiety, speech and thought processing grossly intact  ASSESSMENT AND PLAN:  Discussed the following assessment and plan:  Sore throat  Sinus congestion  Other fatigue  -we discussed possible serious and likely etiologies, options for evaluation and workup, limitations of telemedicine visit vs in person visit, treatment, treatment risks and precautions. Pt prefers to treat via telemedicine empirically rather then risking or undertaking an in person visit at this moment. Query VURI, COVID19 vs other. Discussed sinusitis, but seems less likely given short duration of symptoms and she has a number of abx intolerances. Opted for symptomatic care with nasal saline, prn analgesic, short course afrin and advised COVID19 testing and isolation while sick  and per guidelines if positive. Provided her with the information for the COVID19 testing sites through [facility], treatment options, isolation, precautions. Work/School slipped offered:in patient instructions Advised to seek prompt follow up telemedicine visit or in person care if worsening, new symptoms arise, or if is not improving with treatment.    I discussed the assessment and treatment plan with the  patient. The patient was provided an opportunity to ask questions and all were answered. The patient agreed with the plan and demonstrated an understanding of the instructions.    Terressa Koyanagi, DO

## 2020-09-30 ENCOUNTER — Ambulatory Visit: Payer: BC Managed Care – PPO | Admitting: Family Medicine

## 2020-10-24 ENCOUNTER — Telehealth (INDEPENDENT_AMBULATORY_CARE_PROVIDER_SITE_OTHER): Payer: BC Managed Care – PPO | Admitting: Internal Medicine

## 2020-10-24 ENCOUNTER — Other Ambulatory Visit: Payer: Self-pay

## 2020-10-24 ENCOUNTER — Encounter: Payer: Self-pay | Admitting: Internal Medicine

## 2020-10-24 VITALS — BP 148/94 | HR 94 | Temp 98.4°F | Ht 64.0 in | Wt 135.0 lb

## 2020-10-24 DIAGNOSIS — B349 Viral infection, unspecified: Secondary | ICD-10-CM

## 2020-10-24 LAB — COMPREHENSIVE METABOLIC PANEL
Albumin: 4.3 (ref 3.5–5.0)
Calcium: 8.9 (ref 8.7–10.7)
GFR calc Af Amer: 113
GFR calc non Af Amer: 98
Globulin: 2.8

## 2020-10-24 LAB — HEPATIC FUNCTION PANEL
ALT: 13 (ref 7–35)
AST: 15 (ref 13–35)
Alkaline Phosphatase: 114 (ref 25–125)
Bilirubin, Total: 0.2

## 2020-10-24 LAB — BASIC METABOLIC PANEL
BUN: 8 (ref 4–21)
CO2: 24 — AB (ref 13–22)
Chloride: 104 (ref 99–108)
Creatinine: 0.7 (ref 0.5–1.1)
Glucose: 87
Potassium: 3.9 (ref 3.4–5.3)
Sodium: 144 (ref 137–147)

## 2020-10-24 LAB — CBC AND DIFFERENTIAL
HCT: 41 (ref 36–46)
Hemoglobin: 13.3 (ref 12.0–16.0)
Neutrophils Absolute: 6.2
Platelets: 382 (ref 150–399)
WBC: 9.4

## 2020-10-24 LAB — CBC: RBC: 4.52 (ref 3.87–5.11)

## 2020-10-24 NOTE — Progress Notes (Signed)
Subjective:    Patient ID: Valerie Walker, female    DOB: October 03, 1971, 49 y.o.   MRN: [id number]  DOS:  10/24/2020 Type of visit - description: Virtual Visit via Video Note  I connected with the above patient  by a video enabled telemedicine application and verified that I am speaking with the correct person using two identifiers.   THIS ENCOUNTER IS A VIRTUAL VISIT DUE TO COVID-19 - PATIENT WAS NOT SEEN IN THE OFFICE. PATIENT HAS CONSENTED TO VIRTUAL VISIT / TELEMEDICINE VISIT   Location of patient: home  Location of provider: office  Persons participating in the virtual visit: patient, provider   I discussed the limitations of evaluation and management by telemedicine and the availability of in person appointments. The patient expressed understanding and agreed to proceed.  Acute Symptoms started 6 days ago: Sinus congestion, earache, sore throat, malaise. 3 days ago went to urgent care, rapid test for Covid was negative. Was prescribed a Z-Pak.  She went to work today, she is a [employer] loss adjuster, she complained of some shortness of breath, the school RN listen to her lungs and she hear  some crackling. Subsequently went to urgent care: Chest x-ray reportedly negative O2 sat 96% A rapid Covid test was negative Covid test PCR is pending.    Review of Systems Denies fever Has a history of PE, denies any recent prolonged trips. Denies calf pain or swelling. No chest pain. Admits to some audible chest congestion. No nausea vomiting No myalgias  Past Medical History:  Diagnosis Date  . Allergy   . Asthma   . Diverticulitis   . History of blood transfusion    Had after delivery of baby  . Hx of blood clots 2002   unprovoked  . Hypertension   . Thyroid disease     No past surgical history on file.  Allergies as of 10/24/2020      Reactions   Levaquin [levofloxacin In D5w] Palpitations   palpitations and headaches   Penicillins Anaphylaxis   Erythromycin Hives    Severe rash and hives   Aspirin Rash      Medication List       Accurate as of October 24, 2020  3:57 PM. If you have any questions, ask your nurse or doctor.        amLODipine 5 MG tablet Commonly known as: NORVASC TAKE 1 TABLET BY MOUTH EVERY DAY   triamcinolone 0.025 % cream Commonly known as: KENALOG Apply 1 application topically 2 (two) times daily.          Objective:   Physical Exam BP (!) 148/94 (BP Location: Left Arm, Patient Position: Sitting, Cuff Size: Large)   Pulse 94   Temp 98.4 F (36.9 C) (Oral)   Ht 5\' 4"  (1.626 m)   Wt 135 lb (61.2 kg)   SpO2 96%   BMI 23.17 kg/m  This is virtual video visit, she is alert oriented x3, in no distress, nontoxic appearing.  Speaks in complete sentences. I asked her to cough and her very mild large airway congestion.  No clear-cut wheezing.    Assessment    49 year old female, history of hypertension, menopausal, history of PEs and miscarriages, currently not anticoagulated presents with:  Viral syndrome: The patient has been sick for the last 6 days, see details in the HPI. She is mildly short of breath, chest x-ray and O2 sat done at another office this morning were okay. She is not vaccinated against Covid. PCR testing from  this morning is pending. DDx: Covid, viral syndrome, bronchitis, pneumonia, or others. If she indeed has Covid is very important to document that so she can get a monoclonal infusion. Plan: Awaiting PCR, check a CBC, CMP (at the urgent care) With all results proceed with a CT chest mostly to rule out a viral pneumonia, PE is less likely.  If CT findings consistent with viral pneumonia I think clinically she will qualify for a monoclonal infusion. In the meantime rest, fluids, finish a Z-Pak, ER if worse. She verbalized understanding.    I discussed the assessment and treatment plan with the patient. The patient was provided an opportunity to ask questions and all were answered. The patient  agreed with the plan and demonstrated an understanding of the instructions.   The patient was advised to call back or seek an in-person evaluation if the symptoms worsen or if the condition fails to improve as anticipated.

## 2020-10-25 ENCOUNTER — Other Ambulatory Visit (INDEPENDENT_AMBULATORY_CARE_PROVIDER_SITE_OTHER): Payer: BC Managed Care – PPO

## 2020-10-25 ENCOUNTER — Other Ambulatory Visit: Payer: Self-pay

## 2020-10-25 DIAGNOSIS — R0602 Shortness of breath: Secondary | ICD-10-CM

## 2020-10-25 DIAGNOSIS — R079 Chest pain, unspecified: Secondary | ICD-10-CM

## 2020-10-25 DIAGNOSIS — Z86711 Personal history of pulmonary embolism: Secondary | ICD-10-CM

## 2020-10-25 DIAGNOSIS — B349 Viral infection, unspecified: Secondary | ICD-10-CM | POA: Diagnosis not present

## 2020-10-25 LAB — CBC WITH DIFFERENTIAL/PLATELET
Basophils Absolute: 0.1 10*3/uL (ref 0.0–0.1)
Basophils Relative: 1 % (ref 0.0–3.0)
Eosinophils Absolute: 0.3 10*3/uL (ref 0.0–0.7)
Eosinophils Relative: 4.1 % (ref 0.0–5.0)
HCT: 42.6 % (ref 36.0–46.0)
Hemoglobin: 13.9 g/dL (ref 12.0–15.0)
Lymphocytes Relative: 20.9 % (ref 12.0–46.0)
Lymphs Abs: 1.7 10*3/uL (ref 0.7–4.0)
MCHC: 32.5 g/dL (ref 30.0–36.0)
MCV: 87.9 fl (ref 78.0–100.0)
Monocytes Absolute: 0.6 10*3/uL (ref 0.1–1.0)
Monocytes Relative: 6.7 % (ref 3.0–12.0)
Neutro Abs: 5.6 10*3/uL (ref 1.4–7.7)
Neutrophils Relative %: 67.3 % (ref 43.0–77.0)
Platelets: 401 10*3/uL — ABNORMAL HIGH (ref 150.0–400.0)
RBC: 4.85 Mil/uL (ref 3.87–5.11)
RDW: 13.1 % (ref 11.5–15.5)
WBC: 8.4 10*3/uL (ref 4.0–10.5)

## 2020-10-25 LAB — COMPREHENSIVE METABOLIC PANEL
ALT: 13 U/L (ref 0–35)
AST: 17 U/L (ref 0–37)
Albumin: 4.2 g/dL (ref 3.5–5.2)
Alkaline Phosphatase: 99 U/L (ref 39–117)
BUN: 10 mg/dL (ref 6–23)
CO2: 32 mEq/L (ref 19–32)
Calcium: 9.3 mg/dL (ref 8.4–10.5)
Chloride: 101 mEq/L (ref 96–112)
Creatinine, Ser: 0.76 mg/dL (ref 0.40–1.20)
GFR: 92.39 mL/min (ref >60.00)
Glucose, Bld: 76 mg/dL (ref 70–99)
Potassium: 4.6 mEq/L (ref 3.5–5.1)
Sodium: 138 mEq/L (ref 135–145)
Total Bilirubin: 0.4 mg/dL (ref 0.2–1.2)
Total Protein: 7.5 g/dL (ref 6.0–8.3)

## 2020-10-25 NOTE — Addendum Note (Signed)
Addended byConrad Uhland D on: 10/25/2020 03:34 PM   Modules accepted: Orders

## 2020-10-26 ENCOUNTER — Ambulatory Visit (HOSPITAL_BASED_OUTPATIENT_CLINIC_OR_DEPARTMENT_OTHER)
Admission: RE | Admit: 2020-10-26 | Discharge: 2020-10-26 | Disposition: A | Discharge status: Home / Self Care | Payer: BC Managed Care – PPO | Source: Ambulatory Visit | Attending: Internal Medicine | Admitting: Internal Medicine

## 2020-10-26 ENCOUNTER — Encounter (HOSPITAL_BASED_OUTPATIENT_CLINIC_OR_DEPARTMENT_OTHER): Payer: Self-pay

## 2020-10-26 DIAGNOSIS — R079 Chest pain, unspecified: Secondary | ICD-10-CM | POA: Insufficient documentation

## 2020-10-26 DIAGNOSIS — R0602 Shortness of breath: Secondary | ICD-10-CM | POA: Diagnosis present

## 2020-10-26 DIAGNOSIS — Z86711 Personal history of pulmonary embolism: Secondary | ICD-10-CM | POA: Diagnosis present

## 2020-10-26 DIAGNOSIS — B349 Viral infection, unspecified: Secondary | ICD-10-CM | POA: Diagnosis not present

## 2020-10-26 MED ORDER — IOHEXOL 350 MG/ML SOLN
100.0000 mL | Freq: Once | INTRAVENOUS | Status: AC | PRN
  Administered 2020-10-26: 100 mL via INTRAVENOUS

## 2020-11-01 ENCOUNTER — Encounter: Payer: Self-pay | Admitting: Family Medicine

## 2020-11-08 ENCOUNTER — Telehealth: Payer: BC Managed Care – PPO | Admitting: Nurse Practitioner

## 2020-11-08 DIAGNOSIS — H9209 Otalgia, unspecified ear: Secondary | ICD-10-CM

## 2020-11-08 DIAGNOSIS — R001 Bradycardia, unspecified: Secondary | ICD-10-CM

## 2020-11-08 NOTE — Progress Notes (Signed)
Based on what you shared with me it looks like you have ear pain with low heart rate.,that should be evaluated in a face to face office visit. With your heart rate dropping ypou need to go to urgent care of ER. 48 is a concern in an other wise healthy individual    NOTE: If you entered your credit card information for this eVisit, you will not be charged. You may see a "hold" on your card for the $35 but that hold will drop off and you will not have a charge processed.  If you are having a true medical emergency please call 911.     For an urgent face to face visit, [facility] has four urgent care centers for your convenience:   [facility]    [phone]                                                                                                                                                                                  Get Driving Directions  [address] [place], [place] [address] 10 am to 8 pm Monday-Friday 12 pm to 8 pm [facility] at [facility]  [phone]                                                                                                                                                                                  Get Driving Directions  [address] [place], [place] [address] 8 am to 8 pm Monday-Friday 9 am to 6 pm Saturday 11 am to 6 pm Sunday   [facility] at [facility]  [phone]  Get Driving Directions   [address] [place], [place] [address] 8 am to 8 pm Monday-Friday 8 am to 4 pm [facility] Urgent Care at [facility] Directions  [phone]  [address] [place], [place] [address]  Monday-Friday, 12 PM to 6 PM    Your e-visit answers were reviewed by a board certified advanced clinical practitioner to complete your personal care plan.  Thank you for using e-Visits

## 2020-11-14 ENCOUNTER — Encounter: Payer: Self-pay | Admitting: Family Medicine

## 2020-11-14 ENCOUNTER — Ambulatory Visit: Payer: BC Managed Care – PPO | Admitting: Family Medicine

## 2020-11-14 ENCOUNTER — Other Ambulatory Visit: Payer: Self-pay

## 2020-11-14 VITALS — BP 122/82 | HR 82 | Temp 99.0°F | Ht 64.0 in | Wt 142.0 lb

## 2020-11-14 DIAGNOSIS — J324 Chronic pansinusitis: Secondary | ICD-10-CM | POA: Diagnosis not present

## 2020-11-14 MED ORDER — METHYLPREDNISOLONE ACETATE 80 MG/ML IJ SUSP
80.0000 mg | Freq: Once | INTRAMUSCULAR | Status: AC
  Administered 2020-11-14: 80 mg via INTRAMUSCULAR

## 2020-11-14 MED ORDER — BENZONATATE 100 MG PO CAPS: 100.0000 mg | ORAL_CAPSULE | Freq: Three times a day (TID) | ORAL | 0 refills | Status: DC | PRN

## 2020-11-14 NOTE — Addendum Note (Signed)
Addended by: Scharlene Gloss B on: 11/14/2020 01:44 PM   Modules accepted: Orders

## 2020-11-14 NOTE — Patient Instructions (Addendum)
Use an air humidifier.  OK to take Tylenol 1000 mg (2 extra strength tabs) or 975 mg (3 regular strength tabs) every 6 hours as needed.  Continue to push fluids, practice good hand hygiene, and cover your mouth if you cough.  If you start having fevers, shaking or shortness of breath, seek immediate care.  Send me a message in 2 days (Wednesday afternoon) if no better.   Let us know if you need anything.

## 2020-11-14 NOTE — Progress Notes (Signed)
Chief Complaint  Patient presents with  . Cough  . Sinus Problem    Valerie Walker here for URI complaints.  Duration: 1 month  Associated symptoms: sinus congestion, sinus pain, rhinorrhea, ear pain, sore throat, wheezing, chest tightness and cough Denies: itchy watery eyes, ear drainage, sore throat, shortness of breath, N/V/D, and fevers Treatment to date: doxycycline, Zpak.  CTA neg.  Sick contacts: No  Past Medical History:  Diagnosis Date  . Allergy   . Asthma   . Diverticulitis   . History of blood transfusion    Had after delivery of baby  . Hx of blood clots 2002   unprovoked  . Hypertension   . Thyroid disease     BP 122/82 (BP Location: Left Arm, Patient Position: Sitting, Cuff Size: Normal)   Pulse 82   Temp 99 F (37.2 C) (Oral)   Ht 5\' 4"  (1.626 m)   Wt 142 lb (64.4 kg)   SpO2 98%   BMI 24.37 kg/m  General: Awake, alert, appears stated age HEENT: AT, [place], ears patent b/l and TM's neg, nares patent w/o discharge, pharynx pink and without exudates, MMM, frontal/max sinuses ttp b/l Neck: No masses or asymmetry Heart: RRR Lungs: CTAB, no accessory muscle use Psych: Age appropriate judgment and insight, normal mood and affect  Pansinusitis, unspecified chronicity  Steroid injection today. If no improvement, will trial Clindamycin in 2 d.  Continue to push fluids, practice good hand hygiene, cover mouth when coughing. F/u prn. If starting to experience fevers, shaking, or shortness of breath, seek immediate care. Pt voiced understanding and agreement to the plan.  [place], DO 11/14/20 1:39 PM

## 2020-11-17 ENCOUNTER — Other Ambulatory Visit: Payer: Self-pay | Admitting: Family Medicine

## 2020-11-17 MED ORDER — CLINDAMYCIN HCL 300 MG PO CAPS: 300.0000 mg | ORAL_CAPSULE | Freq: Three times a day (TID) | ORAL | 0 refills | Status: DC

## 2020-12-11 ENCOUNTER — Other Ambulatory Visit: Payer: Self-pay | Admitting: Family Medicine

## 2021-02-22 ENCOUNTER — Encounter: Payer: Self-pay | Admitting: Family Medicine

## 2021-02-22 ENCOUNTER — Other Ambulatory Visit: Payer: Self-pay

## 2021-02-22 ENCOUNTER — Ambulatory Visit: Payer: BC Managed Care – PPO | Admitting: Family Medicine

## 2021-02-22 VITALS — BP 128/84 | HR 68 | Temp 98.0°F | Ht 64.0 in | Wt 146.0 lb

## 2021-02-22 DIAGNOSIS — R109 Unspecified abdominal pain: Secondary | ICD-10-CM | POA: Diagnosis not present

## 2021-02-22 MED ORDER — CYCLOBENZAPRINE HCL 10 MG PO TABS: 5.0000 mg | ORAL_TABLET | Freq: Three times a day (TID) | ORAL | 0 refills | Status: DC | PRN

## 2021-02-22 MED ORDER — MELOXICAM 15 MG PO TABS: 15.0000 mg | ORAL_TABLET | Freq: Every day | ORAL | 0 refills | Status: DC

## 2021-02-22 NOTE — Patient Instructions (Signed)
Ice/cold pack over area for 10-15 min twice daily.  Heat (pad or rice pillow in microwave) over affected area, 10-15 minutes twice daily.   OK to take Tylenol 1000 mg (2 extra strength tabs) or 975 mg (3 regular strength tabs) every 6 hours as needed.  Take Flexeril (cyclobenzaprine) 1-2 hours before planned bedtime. If it makes you drowsy, do not take during the day. You can try half a tab the following night.  Let us know if you need anything.   Mid-Back Strain Rehab It is normal to feel mild stretching, pulling, tightness, or discomfort as you do these exercises, but you should stop right away if you feel sudden pain or your pain gets worse.   Stretching and range of motion exercises This exercise warms up your muscles and joints and improves the movement and flexibility of your back and shoulders. This exercise also help to relieve pain. Exercise A: Chest and spine stretch    1. Lie down on your back on a firm surface. 2. Roll a towel or a small blanket so it is about 4 inches (10 cm) in diameter. 3. Put the towel lengthwise under the middle of your back so it is under your spine, but not under your shoulder blades. 4. To increase the stretch, you may put your hands behind your head and let your elbows fall to your sides. 5. Hold for 30 seconds. Repeat exercise 2 times. Complete this exercise 3 times per week.  Strengthening exercises These exercises build strength and endurance in your back and your shoulder blade muscles. Endurance is the ability to use your muscles for a long time, even after they get tired. Exercise B: Alternating arm and leg raises    1. Get on your hands and knees on a firm surface. If you are on a hard floor, you may want to use padding to cushion your knees, such as an exercise mat. 2. Line up your arms and legs. Your hands should be below your shoulders, and your knees should be below your hips. 3. Lift your left leg behind you. At the same time, raise  your right arm and straighten it in front of you. ? Do not lift your leg higher than your hip. ? Do not lift your arm higher than your shoulder. ? Keep your abdominal and back muscles tight. ? Keep your hips facing the ground. ? Do not arch your back. ? Keep your balance carefully, and do not hold your breath. 4. Hold for 3 seconds. 5. Slowly return to the starting position and repeat with your right leg and your left arm. Repeat 2 times. Complete this exercise 3 times per week. Exercise C: Straight arm rows (shoulder extension)     1. Stand with your feet shoulder width apart. 2. Secure an exercise band to a stable object in front of you so the band is at or above shoulder height. 3. Hold one end of the exercise band in each hand. 4. Straighten your elbows and lift your hands up to shoulder height. 5. Step back, away from the secured end of the exercise band, until the band stretches. 6. Squeeze your shoulder blades together and pull your hands down to the sides of your thighs. Stop when your hands are straight down by your sides. Do not let your hands go behind your body. 7. Hold for 3 seconds. 8. Slowly return to the starting position. Repeat 2 times. Complete this exercise 3 times per week. Exercise D: Shoulder  external rotation, prone 1. Lie on your abdomen on a firm bed so your left / right forearm hangs over the edge of the bed and your upper arm is on the bed, straight out from your body. ? Your elbow should be bent. ? Your palm should be facing your feet. 2. If instructed, hold a 5 lb weight in your hand. 3. Squeeze your shoulder blade toward the middle of your back. Do not let your shoulder lift toward your ear. 4. Keep your elbow bent in an "L" shape (90 degrees) while you slowly move your forearm up toward the ceiling. Move your forearm up to the height of the bed, toward your head. ? Your upper arm should not move. ? At the top of the movement, your palm should face the  floor. 5. Hold for 3 seconds. 6. Slowly return to the starting position and relax your muscles. Repeat 3 times. Complete this exercise 3 times per week. Exercise E: Scapular retraction and external rotation, rowing    1. Sit in a stable chair without armrests, or stand. 2. Secure an exercise band to a stable object in front of you so it is at shoulder height. 3. Hold one end of the exercise band in each hand. 4. Bring your arms out straight in front of you. 5. Step back, away from the secured end of the exercise band, until the band stretches. 6. Pull the band backward. As you do this, bend your elbows and squeeze your shoulder blades together, but avoid letting the rest of your body move. Do not let your shoulders lift up toward your ears. 7. Stop when your elbows are at your sides or slightly behind your body. 8. Hold for 5 seconds. 9. Slowly straighten your arms to return to the starting position. Repeat 2 times. Complete this exercise 3 times per week. Posture and body mechanics    Body mechanics refers to the movements and positions of your body while you do your daily activities. Posture is part of body mechanics. Good posture and healthy body mechanics can help to relieve stress in your body's tissues and joints. Good posture means that your spine is in its natural S-curve position (your spine is neutral), your shoulders are pulled back slightly, and your head is not tipped forward. The following are general guidelines for applying improved posture and body mechanics to your everyday activities. Standing     When standing, keep your spine neutral and your feet about hip-width apart. Keep a slight bend in your knees. Your ears, shoulders, and hips should line up.  When you do a task in which you lean forward while standing in one place for a long time, place one foot up on a stable object that is 2-4 inches (5-10 cm) high, such as a footstool. This helps keep your spine  neutral. Sitting     When sitting, keep your spine neutral and keep your feet flat on the floor. Use a footrest, if necessary, and keep your thighs parallel to the floor. Avoid rounding your shoulders, and avoid tilting your head forward.  When working at a desk or a computer, keep your desk at a height where your hands are slightly lower than your elbows. Slide your chair under your desk so you are close enough to maintain good posture.  When working at a computer, place your monitor at a height where you are looking straight ahead and you do not have to tilt your head forward or downward to  look at the screen. Resting    When lying down and resting, avoid positions that are most painful for you.  If you have pain with activities such as sitting, bending, stooping, or squatting (flexion-based activities), lie in a position in which your body does not bend very much. For example, avoid curling up on your side with your arms and knees near your chest (fetal position).  If you have pain with activities such as standing for a long time or reaching with your arms (extension-based activities), lie with your spine in a neutral position and bend your knees slightly. Try the following positions:  Lying on your side with a pillow between your knees.  Lying on your back with a pillow under your knees.   Lifting     When lifting objects, keep your feet at least shoulder-width apart and tighten your abdominal muscles.  Bend your knees and hips and keep your spine neutral. It is important to lift using the strength of your legs, not your back. Do not lock your knees straight out.  Always ask for help to lift heavy or awkward objects. Make sure you discuss any questions you have with your health care provider.

## 2021-02-22 NOTE — Progress Notes (Signed)
Musculoskeletal Exam  Patient: Valerie Walker DOB: 1971-02-14  DOS: 02/22/2021  SUBJECTIVE:  Chief Complaint:   Chief Complaint  Patient presents with  . rib cage pain on right side    Valerie Walker is a 50 y.o.  female for evaluation and treatment of R side pain.   Onset:  2 weeks ago. No inj or change in activity.  Location: R lower rib cage   Character:  sharp  Progression of issue:  is unchanged Associated symptoms: some nausea, no vomiting; no bruising, redness, swelling, sob, rashes 8/10 pain.  Treatment: to date has been acetaminophen.   Neurovascular symptoms: no  Past Medical History:  Diagnosis Date  . Allergy   . Asthma   . Diverticulitis   . History of blood transfusion    Had after delivery of baby  . Hx of blood clots 2002   unprovoked  . Hypertension   . Thyroid disease     Objective: VITAL SIGNS: BP 128/84 (BP Location: Left Arm, Patient Position: Sitting, Cuff Size: Normal)   Pulse 68   Temp 98 F (36.7 C) (Oral)   Ht 5\' 4"  (1.626 m)   Wt 146 lb (66.2 kg)   SpO2 99%   BMI 25.06 kg/m  Constitutional: Well formed, well developed. No acute distress. Heart: RRR Abd: BS+, S, NT, ND Skin: no erythema Thorax & Lungs: CTAB. No accessory muscle use Musculoskeletal: R rib cage.   Tenderness to palpation: yes over approx R 9-10 on right starting from CVA through the ant anxillary line Crepitus: No Deformity: no Ecchymosis: no Neurologic: Normal sensory function. No focal deficits noted. DTR's equal and symmetric in UE's. No clonus. Psychiatric: Normal mood. Age appropriate judgment and insight. Alert & oriented x 3.    Assessment:  Side pain - Plan: cyclobenzaprine (FLEXERIL) 10 MG tablet, meloxicam (MOBIC) 15 MG tablet  Plan: Stretches/exercises, heat, ice, Tylenol. Warnings about flexeril verbalized and written down.  F/u prn. The patient voiced understanding and agreement to the plan.   [place], DO 02/22/21  9:12  AM

## 2021-03-23 ENCOUNTER — Other Ambulatory Visit: Payer: Self-pay | Admitting: Family Medicine

## 2021-03-23 DIAGNOSIS — R109 Unspecified abdominal pain: Secondary | ICD-10-CM

## 2021-03-28 ENCOUNTER — Telehealth: Payer: BC Managed Care – PPO | Admitting: Physician Assistant

## 2021-03-28 ENCOUNTER — Encounter: Payer: Self-pay | Admitting: Physician Assistant

## 2021-03-28 DIAGNOSIS — J019 Acute sinusitis, unspecified: Secondary | ICD-10-CM | POA: Diagnosis not present

## 2021-03-28 DIAGNOSIS — B9689 Other specified bacterial agents as the cause of diseases classified elsewhere: Secondary | ICD-10-CM

## 2021-03-28 MED ORDER — AZITHROMYCIN 250 MG PO TABS: ORAL_TABLET | ORAL | 0 refills | Status: AC

## 2021-03-28 NOTE — Patient Instructions (Signed)
Instructions sent to patients MyChart.

## 2021-03-28 NOTE — Progress Notes (Signed)
[name], koerber are scheduled for a virtual visit with your provider today.    Just as we do with appointments in the office, we must obtain your consent to participate.  Your consent will be active for this visit and any virtual visit you may have with one of our providers in the next 365 days.    If you have a MyChart account, I can also send a copy of this consent to you electronically.  All virtual visits are billed to your insurance company just like a traditional visit in the office.  As this is a virtual visit, video technology does not allow for your provider to perform a traditional examination.  This may limit your provider's ability to fully assess your condition.  If your provider identifies any concerns that need to be evaluated in person or the need to arrange testing such as labs, EKG, etc, we will make arrangements to do so.    Although advances in technology are sophisticated, we cannot ensure that it will always work on either your end or our end.  If the connection with a video visit is poor, we may have to switch to a telephone visit.  With either a video or telephone visit, we are not always able to ensure that we have a secure connection.   I need to obtain your verbal consent now.   Are you willing to proceed with your visit today?   Valerie Walker has provided verbal consent on 03/28/2021 for a virtual visit (video or telephone).  [name], PA-C 03/28/2021  6:25 PM   Virtual Visit via Video   I connected with patient on 03/28/21 at  6:30 PM EDT by a video enabled telemedicine application and verified that I am speaking with the correct person using two identifiers.  Location patient: Home Location provider: [facility] Persons participating in the virtual visit: Patient, Provider  I discussed the limitations of evaluation and management by telemedicine and the availability of in person appointments. The patient expressed understanding and agreed  to proceed.  Subjective:   HPI:   Patient presents via [facility] today c/o 5+ days of progressively worsening of nasal congestion, head congestion, sinus pressure/sinus pain now with some voice hoarseness. Endorses low-grade fever < 101. Denies ear pain. Notes cheek/tooth pain. Denies chest pain or SOB.  Endorses 4 coworkers who have been sick recently. All tested for negative. Had COVID in January and notes this feels completely different.  Has been taking OTC Benadryl to help with symptoms and to help with sleep.   ROS:   See pertinent positives and negatives per HPI.  Patient Active Problem List   Diagnosis Date Noted  . Essential hypertension 09/01/2018  . Liver lesion 09/01/2018  . History of pulmonary embolus (PE) 09/01/2018    Social History   Tobacco Use  . Smoking status: Never Smoker  . Smokeless tobacco: Never Used  Substance Use Topics  . Alcohol use: Never    Current Outpatient Medications:  .  albuterol (VENTOLIN HFA) 108 (90 Base) MCG/ACT inhaler, Inhale 2 puffs into the lungs every 6 (six) hours as needed., Disp: , Rfl:  .  amLODipine (NORVASC) 5 MG tablet, Take 1 tablet (5 mg total) by mouth daily., Disp: 90 tablet, Rfl: 1 .  benzonatate (TESSALON) 100 MG capsule, Take 1 capsule (100 mg total) by mouth 3 (three) times daily as needed., Disp: 30 capsule, Rfl: 0 .  cyclobenzaprine (FLEXERIL) 10 MG tablet, Take 0.5-1  tablets (5-10 mg total) by mouth 3 (three) times daily as needed for muscle spasms., Disp: 21 tablet, Rfl: 0 .  meloxicam (MOBIC) 15 MG tablet, TAKE 1 TABLET (15 MG TOTAL) BY MOUTH DAILY., Disp: 30 tablet, Rfl: 0 .  triamcinolone (KENALOG) 0.025 % cream, Apply 1 application topically 2 (two) times daily., Disp: , Rfl:   Allergies  Allergen Reactions  . Levaquin [Levofloxacin In D5w] Palpitations    palpitations and headaches  . Penicillins Anaphylaxis  . Erythromycin Hives    Severe rash and hives  . Aspirin Rash   Objective:   There were no  vitals taken for this visit.  Patient is well-developed, well-nourished in no acute distress.  Resting comfortably at home.  Head is normocephalic, atraumatic.  No labored breathing.  Speech is clear and coherent with logical content.  Patient is alert and oriented at baseline.   Assessment and Plan:   1. Acute bacterial sinusitis Will Rx azithromycin giving her multiple drug allergies/intolerances.  She notes this always works for her and she tolerates very well..  Increase fluids.  Rest.  Saline nasal spray.  Probiotic.  Mucinex as directed.  Humidifier in bedroom.  Can start Coricidin HBP for symptomatic relief.  Discussed with her that even though this seems typical of a sinus infection and symptoms are identical for her to previous sinus infections, if things are not improving, if anything worsens or new symptoms develop, she does need to be COVID tested.  Recommend she stay home until feeling better regardless.  - azithromycin (ZITHROMAX) 250 MG tablet; Take 2 tablets on day 1, then 1 tablet daily on days 2 through 5  Dispense: 6 tablet; Refill: 0    [name], [place] 03/28/2021

## 2021-04-24 ENCOUNTER — Other Ambulatory Visit: Payer: Self-pay | Admitting: Family Medicine

## 2021-04-24 DIAGNOSIS — R109 Unspecified abdominal pain: Secondary | ICD-10-CM

## 2021-05-15 ENCOUNTER — Other Ambulatory Visit: Payer: Self-pay | Admitting: Family Medicine

## 2021-06-08 ENCOUNTER — Telehealth: Payer: BC Managed Care – PPO | Admitting: Physician Assistant

## 2021-06-08 DIAGNOSIS — B9689 Other specified bacterial agents as the cause of diseases classified elsewhere: Secondary | ICD-10-CM

## 2021-06-08 DIAGNOSIS — J019 Acute sinusitis, unspecified: Secondary | ICD-10-CM | POA: Diagnosis not present

## 2021-06-08 MED ORDER — AZITHROMYCIN 250 MG PO TABS
ORAL_TABLET | ORAL | 0 refills | Status: AC
Start: 2021-06-08 — End: 2021-06-13

## 2021-06-08 NOTE — Progress Notes (Signed)
Virtual Visit Consent   Valerie Walker, you are scheduled for a virtual visit with a [facility] provider today.     Just as with appointments in the office, your consent must be obtained to participate.  Your consent will be active for this visit and any virtual visit you may have with one of our providers in the next 365 days.     If you have a MyChart account, a copy of this consent can be sent to you electronically.  All virtual visits are billed to your insurance company just like a traditional visit in the office.    As this is a virtual visit, video technology does not allow for your provider to perform a traditional examination.  This may limit your provider's ability to fully assess your condition.  If your provider identifies any concerns that need to be evaluated in person or the need to arrange testing (such as labs, EKG, etc.), we will make arrangements to do so.     Although advances in technology are sophisticated, we cannot ensure that it will always work on either your end or our end.  If the connection with a video visit is poor, the visit may have to be switched to a telephone visit.  With either a video or telephone visit, we are not always able to ensure that we have a secure connection.     I need to obtain your verbal consent now.   Are you willing to proceed with your visit today?    CATELYNN SPARGER has provided verbal consent on 06/08/2021 for a virtual visit (video or telephone).   Valerie Walker, [place]   Date: 06/08/2021 11:51 AM  Virtual Visit via Video Note   I, Valerie Walker, connected with  Valerie Walker  ([id number], Jul 08, 1971) on 06/08/21 at 11:45 AM EDT by a video-enabled telemedicine application and verified that I am speaking with the correct person using two identifiers.  Location: Patient: Virtual Visit Location Patient: Home Provider: Virtual Visit Location Provider: Home Office   I discussed the limitations of evaluation and  management by telemedicine and the availability of in person appointments. The patient expressed understanding and agreed to proceed.    History of Present Illness: Valerie Walker is a 50 y.o. who identifies as a female who was assigned female at birth, and is being seen today for possible sinusitis.  Patient endorses 6+ days of progressively worsening sinus pressure now with maxillary sinus pain and L-sided dental pain. Denies fever, chills, cough, chest congestion or SOB. Negative COVID. Has significant history of sinusitis and notes this feels identical to prior episodes. She has been taking Tylenol OTC for symptoms. Tries to avoid decongestants due to history of HTN.  HPI: HPI  Problems:  Patient Active Problem List   Diagnosis Date Noted   Essential hypertension 09/01/2018   Liver lesion 09/01/2018   History of pulmonary embolus (PE) 09/01/2018    Allergies:  Allergies  Allergen Reactions   Doxycycline Palpitations    Other reaction(s): GI Intolerance Nausea/vomiting    Levaquin [Levofloxacin In D5w] Palpitations    palpitations and headaches   Penicillins Anaphylaxis   Erythromycin Hives    Severe rash and hives   Aspirin Rash   Medications:  Current Outpatient Medications:    azithromycin (ZITHROMAX) 250 MG tablet, Take 2 tablets on day 1, then 1 tablet daily on days 2 through 5, Disp: 6 tablet, Rfl: 0   amLODipine (NORVASC) 5 MG tablet,  TAKE 1 TABLET (5 MG TOTAL) BY MOUTH DAILY., Disp: 90 tablet, Rfl: 1  Observations/Objective: Patient is well-developed, well-nourished in no acute distress.  Resting comfortably at home.  Head is normocephalic, atraumatic.  No labored breathing. Speech is clear and coherent with logical content.  Patient is alert and oriented at baseline.  + TTP maxillary sinuses  Assessment and Plan: 1. Acute bacterial sinusitis - azithromycin (ZITHROMAX) 250 MG tablet; Take 2 tablets on day 1, then 1 tablet daily on days 2 through 5  Dispense: 6  tablet; Refill: 0 Rx Azithromycin giving her multiple drug allergies and history of tolerating Z-pack very well.  Increase fluids.  Rest.  Saline nasal spray.  Probiotic.  Mucinex as directed.  Humidifier in bedroom. Start OTC Flonase. Can use Coricidin HBP.  Follow Up Instructions: I discussed the assessment and treatment plan with the patient. The patient was provided an opportunity to ask questions and all were answered. The patient agreed with the plan and demonstrated an understanding of the instructions.  A copy of instructions were sent to the patient via MyChart.  The patient was advised to call back or seek an in-person evaluation if the symptoms worsen or if the condition fails to improve as anticipated.  Time:  I spent 12 minutes with the patient via telehealth technology discussing the above problems/concerns.    Valerie Climes, PA-C

## 2021-06-08 NOTE — Patient Instructions (Signed)
Valerie Walker, thank you for joining [name], PA-C for today's virtual visit.  While this provider is not your primary care provider (PCP), if your PCP is located in our provider database this encounter information will be shared with them immediately following your visit.  Consent: (Patient) Valerie Walker provided verbal consent for this virtual visit at the beginning of the encounter.  Current Medications:  Current Outpatient Medications:    amLODipine (NORVASC) 5 MG tablet, TAKE 1 TABLET (5 MG TOTAL) BY MOUTH DAILY., Disp: 90 tablet, Rfl: 1   meloxicam (MOBIC) 15 MG tablet, TAKE 1 TABLET (15 MG TOTAL) BY MOUTH DAILY., Disp: 30 tablet, Rfl: 0   Medications ordered in this encounter:  No orders of the defined types were placed in this encounter.    *If you need refills on other medications prior to your next appointment, please contact your pharmacy*  Follow-Up: Call back or seek an in-person evaluation if the symptoms worsen or if the condition fails to improve as anticipated.  Other Instructions Please take antibiotic as directed.  Increase fluid intake.  Use Saline nasal spray.  Take a daily multivitamin. Start OTC Flonase.  Place a humidifier in the bedroom.  Please call or return clinic if symptoms are not improving.  Sinusitis Sinusitis is redness, soreness, and swelling (inflammation) of the paranasal sinuses. Paranasal sinuses are air pockets within the bones of your face (beneath the eyes, the middle of the forehead, or above the eyes). In healthy paranasal sinuses, mucus is able to drain out, and air is able to circulate through them by way of your nose. However, when your paranasal sinuses are inflamed, mucus and air can become trapped. This can allow bacteria and other germs to grow and cause infection. Sinusitis can develop quickly and last only a short time (acute) or continue over a long period (chronic). Sinusitis that lasts for more than 12 weeks is  considered chronic.  CAUSES  Causes of sinusitis include: Allergies. Structural abnormalities, such as displacement of the cartilage that separates your nostrils (deviated septum), which can decrease the air flow through your nose and sinuses and affect sinus drainage. Functional abnormalities, such as when the small hairs (cilia) that line your sinuses and help remove mucus do not work properly or are not present. SYMPTOMS  Symptoms of acute and chronic sinusitis are the same. The primary symptoms are pain and pressure around the affected sinuses. Other symptoms include: Upper toothache. Earache. Headache. Bad breath. Decreased sense of smell and taste. A cough, which worsens when you are lying flat. Fatigue. Fever. Thick drainage from your nose, which often is green and may contain pus (purulent). Swelling and warmth over the affected sinuses. DIAGNOSIS  Your caregiver will perform a physical exam. During the exam, your caregiver may: Look in your nose for signs of abnormal growths in your nostrils (nasal polyps). Tap over the affected sinus to check for signs of infection. View the inside of your sinuses (endoscopy) with a special imaging device with a light attached (endoscope), which is inserted into your sinuses. If your caregiver suspects that you have chronic sinusitis, one or more of the following tests may be recommended: Allergy tests. Nasal culture A sample of mucus is taken from your nose and sent to a lab and screened for bacteria. Nasal cytology A sample of mucus is taken from your nose and examined by your caregiver to determine if your sinusitis is related to an allergy. TREATMENT  Most cases of acute  sinusitis are related to a viral infection and will resolve on their own within 10 days. Sometimes medicines are prescribed to help relieve symptoms (pain medicine, decongestants, nasal steroid sprays, or saline sprays).  However, for sinusitis related to a bacterial  infection, your caregiver will prescribe antibiotic medicines. These are medicines that will help kill the bacteria causing the infection.  Rarely, sinusitis is caused by a fungal infection. In theses cases, your caregiver will prescribe antifungal medicine. For some cases of chronic sinusitis, surgery is needed. Generally, these are cases in which sinusitis recurs more than 3 times per year, despite other treatments. HOME CARE INSTRUCTIONS  Drink plenty of water. Water helps thin the mucus so your sinuses can drain more easily. Use a humidifier. Inhale steam 3 to 4 times a day (for example, sit in the bathroom with the shower running). Apply a warm, moist washcloth to your face 3 to 4 times a day, or as directed by your caregiver. Use saline nasal sprays to help moisten and clean your sinuses. Take over-the-counter or prescription medicines for pain, discomfort, or fever only as directed by your caregiver. SEEK IMMEDIATE MEDICAL CARE IF: You have increasing pain or severe headaches. You have nausea, vomiting, or drowsiness. You have swelling around your face. You have vision problems. You have a stiff neck. You have difficulty breathing. MAKE SURE YOU:  Understand these instructions. Will watch your condition. Will get help right away if you are not doing well or get worse. Document Released: 11/12/2005 Document Revised: 02/04/2012 Document Reviewed: 11/27/2011 [facility] Patient Information 2014 [place], [place].    If you have been instructed to have an in-person evaluation today at a local Urgent Care facility, please use the link below. It will take you to a list of all of our available [facility], including address, phone number and hours of operation. Please do not delay care.  [facility]  If you or a family member do not have a primary care provider, use the link below to schedule a visit and establish care. When you choose a [facility] primary care  physician or advanced practice provider, you gain a long-term partner in health. Find a Primary Care Provider  Learn more about [facility]'s in-office and virtual care options:  - Get Care Now

## 2021-09-25 ENCOUNTER — Encounter: Payer: Self-pay | Admitting: Physician Assistant

## 2021-09-25 ENCOUNTER — Telehealth: Payer: BC Managed Care – PPO | Admitting: Physician Assistant

## 2021-09-25 DIAGNOSIS — J069 Acute upper respiratory infection, unspecified: Secondary | ICD-10-CM | POA: Diagnosis not present

## 2021-09-25 MED ORDER — AZITHROMYCIN 250 MG PO TABS: ORAL_TABLET | ORAL | 0 refills | Status: AC

## 2021-09-25 NOTE — Patient Instructions (Signed)
  Valerie Walker, thank you for joining Karrie Meres, PA-C for today's virtual visit.  While this provider is not your primary care provider (PCP), if your PCP is located in our provider database this encounter information will be shared with them immediately following your visit.  Consent: (Patient) Valerie Walker provided verbal consent for this virtual visit at the beginning of the encounter.  Current Medications:  Current Outpatient Medications:    amLODipine (NORVASC) 5 MG tablet, TAKE 1 TABLET (5 MG TOTAL) BY MOUTH DAILY., Disp: 90 tablet, Rfl: 1   Medications ordered in this encounter:  No orders of the defined types were placed in this encounter.    *If you need refills on other medications prior to your next appointment, please contact your pharmacy*  Follow-Up: Call back or seek an in-person evaluation if the symptoms worsen or if the condition fails to improve as anticipated.  Other Instructions Take azithromycin as directed. Please also fill your prescription for tessalon.   Follow up with your doctor in 1 week for reassessment and seek care sooner for new or worsening symptoms.   If you have been instructed to have an in-person evaluation today at a local Urgent Care facility, please use the link below. It will take you to a list of all of our available [facility], including address, phone number and hours of operation. Please do not delay care.  [facility]  If you or a family member do not have a primary care provider, use the link below to schedule a visit and establish care. When you choose a [facility] primary care physician or advanced practice provider, you gain a long-term partner in health. Find a Primary Care Provider  Learn more about [facility]'s in-office and virtual care options:  - Get Care Now

## 2021-09-25 NOTE — Progress Notes (Signed)
[name], Valerie Walker are scheduled for a virtual visit with your provider today.    Just as we do with appointments in the office, we must obtain your consent to participate.  Your consent will be active for this visit and any virtual visit you may have with one of our providers in the next 365 days.    If you have a MyChart account, I can also send a copy of this consent to you electronically.  All virtual visits are billed to your insurance company just like a traditional visit in the office.  As this is a virtual visit, video technology does not allow for your provider to perform a traditional examination.  This may limit your provider's ability to fully assess your condition.  If your provider identifies any concerns that need to be evaluated in person or the need to arrange testing such as labs, EKG, etc, we will make arrangements to do so.    Although advances in technology are sophisticated, we cannot ensure that it will always work on either your end or our end.  If the connection with a video visit is poor, we may have to switch to a telephone visit.  With either a video or telephone visit, we are not always able to ensure that we have a secure connection.   I need to obtain your verbal consent now.   Are you willing to proceed with your visit today?   Valerie Walker has provided verbal consent on 09/25/2021 for a virtual visit (video or telephone).   Karrie Meres, PA-C 09/25/2021  6:07 PM   Date:  09/25/2021   ID:  Valerie Walker, DOB Jan 03, 1971, MRN [id number]  Patient Location: Home Provider Location: Home Office   Participants: Patient and Provider for Visit and Wrap up  Method of visit: Video  Location of Patient: Home Location of Provider: Home Office Consent was obtain for visit over the video. Services rendered by provider: Visit was performed via video  A video enabled telemedicine application was used and I verified that I am speaking with the correct person using  two identifiers.  PCP:  Sharlene Dory, DO   Chief Complaint:  Glenford Peers sxs  History of Present Illness:    Valerie Walker is a 50 y.o. female with history as stated below. Presents video telehealth for an acute care visit  She reports sore throat, malaise, cough, congestion since last week. She reports some fevers at home. She tested negative for covid/flu. 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 She has tested negative for covid twice and negative for the flu yesterday  Past Medical, Surgical, Social History, Allergies, and Medications have been Reviewed.  Past Medical History:  Diagnosis Date   Allergy    Asthma    Diverticulitis    History of blood transfusion    Had after delivery of baby   Hx of blood clots 2002   unprovoked   Hypertension    Thyroid disease     Current Meds  Medication Sig   azithromycin (ZITHROMAX) 250 MG tablet Take 2 tablets on day 1, then 1 tablet daily on days 2 through 5     Allergies:   Doxycycline, Levaquin [levofloxacin in d5w], Penicillins, Erythromycin, and Aspirin   ROS See HPI for history of present illness.  Physical Exam Vitals and nursing note reviewed.  Constitutional:      General:  She is not in acute distress.    Appearance: She is well-developed.  HENT:     Head: Normocephalic and atraumatic.  Eyes:     Conjunctiva/sclera: Conjunctivae normal.  Pulmonary:     Effort: Pulmonary effort is normal.     Comments: Speaking in full sentences Musculoskeletal:        General: Normal range of motion.     Cervical back: Neck supple.  Skin:    General: Skin is warm and dry.  Neurological:     Mental Status: She is alert.            MDM: pt with uri sxs. Concern for bacterial symptoms. Will tx with azithromycin and tessalon. Have advised on close f/u.  There are no diagnoses linked to this encounter.   Time:   Today, I have spent 10 minutes  with the patient with telehealth technology discussing the above problems, reviewing the chart, previous notes, medications and orders.    Tests Ordered: No orders of the defined types were placed in this encounter.   Medication Changes: Meds ordered this encounter  Medications   azithromycin (ZITHROMAX) 250 MG tablet    Sig: Take 2 tablets on day 1, then 1 tablet daily on days 2 through 5    Dispense:  6 tablet    Refill:  0    Order Specific Question:   Supervising Provider    Answer:   Eber Hong [[id number]]     Disposition:  Follow up  Signed, Karrie Meres, PA-C  09/25/2021 6:07 PM

## 2021-11-03 ENCOUNTER — Encounter: Payer: Self-pay | Admitting: Family Medicine

## 2021-11-03 ENCOUNTER — Ambulatory Visit: Payer: BC Managed Care – PPO | Admitting: Family Medicine

## 2021-11-03 VITALS — BP 128/80 | HR 89 | Temp 98.7°F | Ht 64.0 in | Wt 142.5 lb

## 2021-11-03 DIAGNOSIS — J329 Chronic sinusitis, unspecified: Secondary | ICD-10-CM

## 2021-11-03 DIAGNOSIS — R509 Fever, unspecified: Secondary | ICD-10-CM

## 2021-11-03 LAB — POC COVID19 BINAXNOW: SARS Coronavirus 2 Ag: NEGATIVE

## 2021-11-03 LAB — POCT INFLUENZA A/B
Influenza A, POC: NEGATIVE
Influenza B, POC: NEGATIVE

## 2021-11-03 LAB — POCT RAPID STREP A (OFFICE): Rapid Strep A Screen: NEGATIVE

## 2021-11-03 MED ORDER — FLUCONAZOLE 150 MG PO TABS: ORAL_TABLET | ORAL | 0 refills | Status: DC

## 2021-11-03 MED ORDER — LEVOCETIRIZINE DIHYDROCHLORIDE 5 MG PO TABS: 5.0000 mg | ORAL_TABLET | Freq: Every evening | ORAL | 2 refills | Status: DC

## 2021-11-03 MED ORDER — CLINDAMYCIN HCL 300 MG PO CAPS: 300.0000 mg | ORAL_CAPSULE | Freq: Three times a day (TID) | ORAL | 0 refills | Status: AC

## 2021-11-03 MED ORDER — MOMETASONE FUROATE 50 MCG/ACT NA SUSP: 2.0000 | Freq: Every day | NASAL | 12 refills | Status: DC

## 2021-11-03 NOTE — Patient Instructions (Addendum)
Take a probiotic with the antibiotic.   If you do not hear anything about your referral in the next 1-2 weeks, call our office and ask for an update.  Continue to push fluids, practice good hand hygiene, and cover your mouth if you cough.  If you start having fevers, shaking or shortness of breath, seek immediate care.  OK to take Tylenol 1000 mg (2 extra strength tabs) or 975 mg (3 regular strength tabs) every 6 hours as needed.  Claritin (loratadine), Allegra (fexofenadine), Zyrtec (cetirizine) which is also equivalent to Xyzal (levocetirizine); these are listed in order from weakest to strongest. Generic, and therefore cheaper, options are in the parentheses.   Flonase (fluticasone); nasal spray that is over the counter. 2 sprays each nostril, once daily. Aim towards the same side eye when you spray.  There are available OTC, and the generic versions, which may be cheaper, are in parentheses. Show this to a pharmacist if you have trouble finding any of these items.  Let us know if you need anything.

## 2021-11-03 NOTE — Progress Notes (Signed)
Chief Complaint  Patient presents with   Sinus Problem   Ear Pain    Valerie Walker here for URI complaints.  Duration: 2 days; recurrent sinus infections over past mo  Associated symptoms: Fever (102 F max), sinus congestion, sinus pain, rhinorrhea, ear pain, sore throat, chest tightness, myalgia, and slight cough Denies: itchy watery eyes, ear drainage, wheezing, shortness of breath, and loss of taste/smell Treatment to date: Tylenol Sick contacts: No  Past Medical History:  Diagnosis Date   Allergy    Asthma    Diverticulitis    History of blood transfusion    Had after delivery of baby   Hx of blood clots 2002   unprovoked   Hypertension    Thyroid disease     Objective BP 128/80   Pulse 89   Temp 98.7 F (37.1 C) (Oral)   Ht 5\' 4"  (1.626 m)   Wt 142 lb 8 oz (64.6 kg)   SpO2 99%   BMI 24.46 kg/m  General: Awake, alert, appears stated age HEENT: AT, [place], ears patent b/l and TM's neg, nares patent w/o discharge, pharynx pink and without exudates, MMM, maxillary sinuses tender to palpation bilaterally, frontal sinuses are not tender Neck: No masses or asymmetry Heart: RRR Lungs: CTAB, no accessory muscle use Psych: Age appropriate judgment and insight, normal mood and affect  Recurrent sinusitis - Plan: Ambulatory referral to ENT, clindamycin (CLEOCIN) 300 MG capsule, CT MAXILLOFACIAL WO CONTRAST, levocetirizine (XYZAL) 5 MG tablet, mometasone (NASONEX) 50 MCG/ACT nasal spray, POCT rapid strep A, POC COVID-19, POCT Influenza A/B  Fever, unspecified - Plan: POCT rapid strep A, POC COVID-19, POCT Influenza A/B  Continue to push fluids, practice good hand hygiene. Rapid strep neg. Will tx w [facility] given fevers and sinus pain. Ck CT sinus for anatomic abn r/o and refer to ENT. Start INCS and PO antihis daily. Tylenol prn.  F/u prn. If starting to experience irr erythematous eplaceable fluid loss, shaking, or shortness of breath, seek immediate care. Pt voiced  understanding and agreement to the plan.  [place], DO 11/03/21 4:54 PM

## 2021-11-08 ENCOUNTER — Ambulatory Visit: Payer: BC Managed Care – PPO | Admitting: Family Medicine

## 2021-11-15 ENCOUNTER — Ambulatory Visit (HOSPITAL_BASED_OUTPATIENT_CLINIC_OR_DEPARTMENT_OTHER): Payer: BC Managed Care – PPO

## 2021-11-15 NOTE — Addendum Note (Signed)
Addended by: Scharlene Gloss B on: 11/15/2021 02:11 PM   Modules accepted: Orders

## 2021-11-16 ENCOUNTER — Other Ambulatory Visit: Payer: Self-pay | Admitting: Family Medicine

## 2021-11-28 ENCOUNTER — Encounter: Payer: Self-pay | Admitting: Family Medicine

## 2021-11-28 ENCOUNTER — Other Ambulatory Visit: Payer: Self-pay | Admitting: Family Medicine

## 2021-11-28 DIAGNOSIS — R5383 Other fatigue: Secondary | ICD-10-CM

## 2021-11-30 ENCOUNTER — Other Ambulatory Visit (INDEPENDENT_AMBULATORY_CARE_PROVIDER_SITE_OTHER): Payer: BC Managed Care – PPO

## 2021-11-30 DIAGNOSIS — R5383 Other fatigue: Secondary | ICD-10-CM

## 2021-11-30 LAB — CBC
HCT: 42.5 % (ref 36.0–46.0)
Hemoglobin: 13.6 g/dL (ref 12.0–15.0)
MCHC: 32.1 g/dL (ref 30.0–36.0)
MCV: 87.7 fl (ref 78.0–100.0)
Platelets: 409 10*3/uL — ABNORMAL HIGH (ref 150.0–400.0)
RBC: 4.84 Mil/uL (ref 3.87–5.11)
RDW: 13.1 % (ref 11.5–15.5)
WBC: 7.8 10*3/uL (ref 4.0–10.5)

## 2021-11-30 LAB — COMPREHENSIVE METABOLIC PANEL
ALT: 14 U/L (ref 0–35)
AST: 18 U/L (ref 0–37)
Albumin: 4.3 g/dL (ref 3.5–5.2)
Alkaline Phosphatase: 112 U/L (ref 39–117)
BUN: 12 mg/dL (ref 6–23)
CO2: 32 mEq/L (ref 19–32)
Calcium: 9.9 mg/dL (ref 8.4–10.5)
Chloride: 101 mEq/L (ref 96–112)
Creatinine, Ser: 0.84 mg/dL (ref 0.40–1.20)
GFR: 81.3 mL/min (ref >60.00)
Glucose, Bld: 100 mg/dL — ABNORMAL HIGH (ref 70–99)
Potassium: 4.6 mEq/L (ref 3.5–5.1)
Sodium: 140 mEq/L (ref 135–145)
Total Bilirubin: 0.5 mg/dL (ref 0.2–1.2)
Total Protein: 7.5 g/dL (ref 6.0–8.3)

## 2022-01-29 ENCOUNTER — Ambulatory Visit: Payer: BC Managed Care – PPO | Admitting: Family Medicine

## 2022-01-29 ENCOUNTER — Encounter: Payer: Self-pay | Admitting: Family Medicine

## 2022-01-29 ENCOUNTER — Telehealth: Payer: Self-pay

## 2022-01-29 VITALS — BP 118/78 | HR 61 | Temp 97.8°F | Ht 64.0 in | Wt 143.2 lb

## 2022-01-29 DIAGNOSIS — Z1231 Encounter for screening mammogram for malignant neoplasm of breast: Secondary | ICD-10-CM | POA: Diagnosis not present

## 2022-01-29 DIAGNOSIS — S76111A Strain of right quadriceps muscle, fascia and tendon, initial encounter: Secondary | ICD-10-CM | POA: Diagnosis not present

## 2022-01-29 DIAGNOSIS — M545 Low back pain, unspecified: Secondary | ICD-10-CM

## 2022-01-29 DIAGNOSIS — Z1211 Encounter for screening for malignant neoplasm of colon: Secondary | ICD-10-CM | POA: Diagnosis not present

## 2022-01-29 MED ORDER — MELOXICAM 15 MG PO TABS: 15.0000 mg | ORAL_TABLET | Freq: Every day | ORAL | 0 refills | Status: DC

## 2022-01-29 MED ORDER — TRIAMCINOLONE ACETONIDE 0.1 % EX CREA: 1.0000 "application " | TOPICAL_CREAM | Freq: Two times a day (BID) | CUTANEOUS | 0 refills | Status: DC

## 2022-01-29 MED ORDER — TIZANIDINE HCL 4 MG PO TABS: 4.0000 mg | ORAL_TABLET | Freq: Four times a day (QID) | ORAL | 0 refills | Status: DC | PRN

## 2022-01-29 NOTE — Telephone Encounter (Signed)
Caller Name [name] ?Caller Phone Number [phone] ?Patient Name Valerie Walker ?Patient DOB Nov 08, 1971 ?Call Type Message Only Information Provided ?Reason for Call Request to Schedule Office Appointment ?Initial Comment Caller states she has back pain, and hip pain. ?Patient request to speak to RN No ?Disp. Time Disposition Final User ?01/29/2022 7:45:38 AM General Information Provided Yes Lois Huxley ?Call Closed By: Lois Huxley ?Transaction Date/Time: 01/29/2022 7:43:57 AM (ET) ?

## 2022-01-29 NOTE — Telephone Encounter (Signed)
Called and scheduled with PCP today 3/6/at 1:15 ?

## 2022-01-29 NOTE — Patient Instructions (Addendum)
OK to take Tylenol 1000 mg (2 extra strength tabs) or 975 mg (3 regular strength tabs) every 6 hours as needed.  Heat (pad or rice pillow in microwave) over affected area, 10-15 minutes twice daily.   Ice/cold pack over area for 10-15 min twice daily.  [facility] at [phone] for an appointment.  They are located at [address], [address], [place], [place], [phone] (right across the hall from our office).  Foods that may reduce pain: 1) Ginger 2) Blueberries 3) Salmon 4) Pumpkin seeds 5) dark chocolate 6) turmeric 7) tart cherries 8) virgin olive oil 9) chilli peppers 10) mint 11) red wine  Let us know if you need anything.  Quadriceps Strain Rehab It is normal to feel mild stretching, pulling, tightness, or discomfort as you do these exercises, but you should stop right away if you feel sudden pain or your pain gets worse. Stretching and range of motion exercises These exercises warm up your muscles and joints and improve the movement and flexibility of your thigh. These exercises can also help to relieve stiffness or swelling. Exercise A: Heel slides    Lie on your back with both knees straight. If this causes back discomfort, bend the knee of your healthy leg, placing your foot flat on the floor. Slowly slide your left / right heel back toward your buttocks until you feel a gentle stretch in the front of your knee or thigh. Hold for 30 seconds. Then slowly slide your heel back to the starting position. Repeat 2 times. Complete this exercise 3 times a week. Exercise B: Quadriceps stretch, prone    Lie on your abdomen on a firm surface, such as a bed or padded floor. Bend your left / right knee and hold your ankle. If you cannot reach your ankle or pant leg, loop a belt around your foot and grab the belt instead. Gently pull your heel toward your buttocks. Your knee should not slide out to the side. You should feel a  stretch in the front of your thigh and knee. Hold this position for 30 seconds. Repeat 2 times. Complete this exercise 3 times a week. Strengthening exercises These exercises build strength and endurance in your thigh. Endurance is the ability to use your muscles for a long time, even after your muscles get tired. Exercise C: Straight leg raises (quadriceps and hip flexors) Quality counts! Watch for signs that the quadriceps muscle is working to ensure that you are strengthening the correct muscles and not cheating by using healthier muscles. Lie on your back with your left / right leg extended and your other knee bent. Tense the muscles in the front of your left / right thigh. You should see your kneecap slide up or see increased dimpling just above the knee. Tighten these muscles even more and raise your leg 4-6 inches (10-15 cm) off the floor. Hold for 3 seconds. Keep the thigh muscles tense as you lower your leg. Relax the muscles slowly and completely after each repetition. Repeat 2 times. Complete this exercise 3 times a week. Exercise D: Straight leg raises (hip extensors) Lie on your belly on a bed or a firm surface with a pillow under your hips. Bend your left / right knee so your foot is straight up in the air. Tense your buttock muscles and lift your left / right thigh off the bed. Do not let your back arch. Hold this position for 3  seconds. Slowly return to the starting position. Let your muscles relax completely before doing another repetition. Repeat 2 times. Complete this exercise 3 times a week. Exercise E: Wall sits    Follow the directions for form closely. If you do not place your feet and knees properly, this can lead to knee pain. Lean back against a smooth wall or door and walk your feet out 18-24 inches (46-61 cm) from it. Place your feet hip-width apart. Slowly slide down the wall or door until your knees bend  60-90 degrees. Keep your weight back and over your  heels, not over your toes. Keep your thighs straight or pointing slightly outward. Hold for 1 second. Use your thigh and buttock muscles to push you back up to a standing position. Keep your weight through your heels while you do this. Rest for 5 seconds in between repetitions. Repeat 2 times. Complete this exercise 3 times a week. Make sure you discuss any questions you have with your health care provider. Document Released: 11/12/2005 Document Revised: 07/19/2016 Document Reviewed: 08/16/2015 Elsevier Interactive Patient Education  2018 [employer]   EXERCISES  RANGE OF MOTION (ROM) AND STRETCHING EXERCISES - Low Back Pain Most people with lower back pain will find that their symptoms get worse with excessive bending forward (flexion) or arching at the lower back (extension). The exercises that will help resolve your symptoms will focus on the opposite motion.  If you have pain, numbness or tingling which travels down into your buttocks, leg or foot, the goal of the therapy is for these symptoms to move closer to your back and eventually resolve. Sometimes, these leg symptoms will get better, but your lower back pain may worsen. This is often an indication of progress in your rehabilitation. Be very alert to any changes in your symptoms and the activities in which you participated in the 24 hours prior to the change. Sharing this information with your caregiver will allow him or her to most efficiently treat your condition. These exercises may help you when beginning to rehabilitate your injury. Your symptoms may resolve with or without further involvement from your physician, physical therapist or athletic trainer. While completing these exercises, remember:  Restoring tissue flexibility helps normal motion to return to the joints. This allows healthier, less painful movement and activity. An effective stretch should be held for at least 30 seconds. A stretch should never be painful. You should  only feel a gentle lengthening or release in the stretched tissue. FLEXION RANGE OF MOTION AND STRETCHING EXERCISES:  STRETCH - Flexion, Single Knee to Chest  Lie on a firm bed or floor with both legs extended in front of you. Keeping one leg in contact with the floor, bring your opposite knee to your chest. Hold your leg in place by either grabbing behind your thigh or at your knee. Pull until you feel a gentle stretch in your low back. Hold 30 seconds. Slowly release your grasp and repeat the exercise with the opposite side. Repeat 2 times. Complete this exercise 3 times per week.   STRETCH - Flexion, Double Knee to Chest Lie on a firm bed or floor with both legs extended in front of you. Keeping one leg in contact with the floor, bring your opposite knee to your chest. Tense your stomach muscles to support your back and then lift your other knee to your chest. Hold your legs in place by either grabbing behind your thighs or at your knees. Pull both knees toward  your chest until you feel a gentle stretch in your low back. Hold 30 seconds. Tense your stomach muscles and slowly return one leg at a time to the floor. Repeat 2 times. Complete this exercise 3 times per week.   STRETCH - Low Trunk Rotation Lie on a firm bed or floor. Keeping your legs in front of you, bend your knees so they are both pointed toward the ceiling and your feet are flat on the floor. Extend your arms out to the side. This will stabilize your upper body by keeping your shoulders in contact with the floor. Gently and slowly drop both knees together to one side until you feel a gentle stretch in your low back. Hold for 30 seconds. Tense your stomach muscles to support your lower back as you bring your knees back to the starting position. Repeat the exercise to the other side. Repeat 2 times. Complete this exercise at least 3 times per week.   EXTENSION RANGE OF MOTION AND FLEXIBILITY EXERCISES:  STRETCH - Extension,  Prone on Elbows  Lie on your stomach on the floor, a bed will be too soft. Place your palms about shoulder width apart and at the height of your head. Place your elbows under your shoulders. If this is too painful, stack pillows under your chest. Allow your body to relax so that your hips drop lower and make contact more completely with the floor. Hold this position for 30 seconds. Slowly return to lying flat on the floor. Repeat 2 times. Complete this exercise 3 times per week.   RANGE OF MOTION - Extension, Prone Press Ups Lie on your stomach on the floor, a bed will be too soft. Place your palms about shoulder width apart and at the height of your head. Keeping your back as relaxed as possible, slowly straighten your elbows while keeping your hips on the floor. You may adjust the placement of your hands to maximize your comfort. As you gain motion, your hands will come more underneath your shoulders. Hold this position 30 seconds. Slowly return to lying flat on the floor. Repeat 2 times. Complete this exercise 3 times per week.   RANGE OF MOTION- Quadruped, Neutral Spine  Assume a hands and knees position on a firm surface. Keep your hands under your shoulders and your knees under your hips. You may place padding under your knees for comfort. Drop your head and point your tailbone toward the ground below you. This will round out your lower back like an angry cat. Hold this position for 30 seconds. Slowly lift your head and release your tail bone so that your back sags into a large arch, like an old horse. Hold this position for 30 seconds. Repeat this until you feel limber in your low back. Now, find your "sweet spot." This will be the most comfortable position somewhere between the two previous positions. This is your neutral spine. Once you have found this position, tense your stomach muscles to support your low back. Hold this position for 30 seconds. Repeat 2 times. Complete this  exercise 3 times per week.   STRENGTHENING EXERCISES - Low Back Sprain These exercises may help you when beginning to rehabilitate your injury. These exercises should be done near your "sweet spot." This is the neutral, low-back arch, somewhere between fully rounded and fully arched, that is your least painful position. When performed in this safe range of motion, these exercises can be used for people who have either a flexion or  extension based injury. These exercises may resolve your symptoms with or without further involvement from your physician, physical therapist or athletic trainer. While completing these exercises, remember:  Muscles can gain both the endurance and the strength needed for everyday activities through controlled exercises. Complete these exercises as instructed by your physician, physical therapist or athletic trainer. Increase the resistance and repetitions only as guided. You may experience muscle soreness or fatigue, but the pain or discomfort you are trying to eliminate should never worsen during these exercises. If this pain does worsen, stop and make certain you are following the directions exactly. If the pain is still present after adjustments, discontinue the exercise until you can discuss the trouble with your caregiver.  STRENGTHENING - Deep Abdominals, Pelvic Tilt  Lie on a firm bed or floor. Keeping your legs in front of you, bend your knees so they are both pointed toward the ceiling and your feet are flat on the floor. Tense your lower abdominal muscles to press your low back into the floor. This motion will rotate your pelvis so that your tail bone is scooping upwards rather than pointing at your feet or into the floor. With a gentle tension and even breathing, hold this position for 3 seconds. Repeat 2 times. Complete this exercise 3 times per week.   STRENGTHENING - Abdominals, Crunches  Lie on a firm bed or floor. Keeping your legs in front of you, bend your  knees so they are both pointed toward the ceiling and your feet are flat on the floor. Cross your arms over your chest. Slightly tip your chin down without bending your neck. Tense your abdominals and slowly lift your trunk high enough to just clear your shoulder blades. Lifting higher can put excessive stress on the lower back and does not further strengthen your abdominal muscles. Control your return to the starting position. Repeat 2 times. Complete this exercise 3 times per week.   STRENGTHENING - Quadruped, Opposite UE/LE Lift  Assume a hands and knees position on a firm surface. Keep your hands under your shoulders and your knees under your hips. You may place padding under your knees for comfort. Find your neutral spine and gently tense your abdominal muscles so that you can maintain this position. Your shoulders and hips should form a rectangle that is parallel with the floor and is not twisted. Keeping your trunk steady, lift your right hand no higher than your shoulder and then your left leg no higher than your hip. Make sure you are not holding your breath. Hold this position for 30 seconds. Continuing to keep your abdominal muscles tense and your back steady, slowly return to your starting position. Repeat with the opposite arm and leg. Repeat 2 times. Complete this exercise 3 times per week.   STRENGTHENING - Abdominals and Quadriceps, Straight Leg Raise  Lie on a firm bed or floor with both legs extended in front of you. Keeping one leg in contact with the floor, bend the other knee so that your foot can rest flat on the floor. Find your neutral spine, and tense your abdominal muscles to maintain your spinal position throughout the exercise. Slowly lift your straight leg off the floor about 6 inches for a count of 3, making sure to not hold your breath. Still keeping your neutral spine, slowly lower your leg all the way to the floor. Repeat this exercise with each leg 2 times.  Complete this exercise 3 times per week.  POSTURE AND BODY  MECHANICS CONSIDERATIONS - Low Back Sprain Keeping correct posture when sitting, standing or completing your activities will reduce the stress put on different body tissues, allowing injured tissues a chance to heal and limiting painful experiences. The following are general guidelines for improved posture.  While reading these guidelines, remember: The exercises prescribed by your provider will help you have the flexibility and strength to maintain correct postures. The correct posture provides the best environment for your joints to work. All of your joints have less wear and tear when properly supported by a spine with good posture. This means you will experience a healthier, less painful body. Correct posture must be practiced with all of your activities, especially prolonged sitting and standing. Correct posture is as important when doing repetitive low-stress activities (typing) as it is when doing a single heavy-load activity (lifting).  RESTING POSITIONS Consider which positions are most painful for you when choosing a resting position. If you have pain with flexion-based activities (sitting, bending, stooping, squatting), choose a position that allows you to rest in a less flexed posture. You would want to avoid curling into a fetal position on your side. If your pain worsens with extension-based activities (prolonged standing, working overhead), avoid resting in an extended position such as sleeping on your stomach. Most people will find more comfort when they rest with their spine in a more neutral position, neither too rounded nor too arched. Lying on a non-sagging bed on your side with a pillow between your knees, or on your back with a pillow under your knees will often provide some relief. Keep in mind, being in any one position for a prolonged period of time, no matter how correct your posture, can still lead to stiffness.  PROPER  SITTING POSTURE In order to minimize stress and discomfort on your spine, you must sit with correct posture. Sitting with good posture should be effortless for a healthy body. Returning to good posture is a gradual process. Many people can work toward this most comfortably by using various supports until they have the flexibility and strength to maintain this posture on their own. When sitting with proper posture, your ears will fall over your shoulders and your shoulders will fall over your hips. You should use the back of the chair to support your upper back. Your lower back will be in a neutral position, just slightly arched. You may place a small pillow or folded towel at the base of your lower back for  support.  When working at a desk, create an environment that supports good, upright posture. Without extra support, muscles tire, which leads to excessive strain on joints and other tissues. Keep these recommendations in mind:  CHAIR: A chair should be able to slide under your desk when your back makes contact with the back of the chair. This allows you to work closely. The chair's height should allow your eyes to be level with the upper part of your monitor and your hands to be slightly lower than your elbows.  BODY POSITION Your feet should make contact with the floor. If this is not possible, use a foot rest. Keep your ears over your shoulders. This will reduce stress on your neck and low back.  INCORRECT SITTING POSTURES  If you are feeling tired and unable to assume a healthy sitting posture, do not slouch or slump. This puts excessive strain on your back tissues, causing more damage and pain. Healthier options include: Using more support, like a lumbar pillow. Switching  tasks to something that requires you to be upright or walking. Talking a brief walk. Lying down to rest in a neutral-spine position.  PROLONGED STANDING WHILE SLIGHTLY LEANING FORWARD  When completing a task that  requires you to lean forward while standing in one place for a long time, place either foot up on a stationary 2-4 inch high object to help maintain the best posture. When both feet are on the ground, the lower back tends to lose its slight inward curve. If this curve flattens (or becomes too large), then the back and your other joints will experience too much stress, tire more quickly, and can cause pain.  CORRECT STANDING POSTURES Proper standing posture should be assumed with all daily activities, even if they only take a few moments, like when brushing your teeth. As in sitting, your ears should fall over your shoulders and your shoulders should fall over your hips. You should keep a slight tension in your abdominal muscles to brace your spine. Your tailbone should point down to the ground, not behind your body, resulting in an over-extended swayback posture.   INCORRECT STANDING POSTURES  Common incorrect standing postures include a forward head, locked knees and/or an excessive swayback. WALKING Walk with an upright posture. Your ears, shoulders and hips should all line-up.  PROLONGED ACTIVITY IN A FLEXED POSITION When completing a task that requires you to bend forward at your waist or lean over a low surface, try to find a way to stabilize 3 out of 4 of your limbs. You can place a hand or elbow on your thigh or rest a knee on the surface you are reaching across. This will provide you more stability, so that your muscles do not tire as quickly. By keeping your knees relaxed, or slightly bent, you will also reduce stress across your lower back. CORRECT LIFTING TECHNIQUES  DO : Assume a wide stance. This will provide you more stability and the opportunity to get as close as possible to the object which you are lifting. Tense your abdominals to brace your spine. Bend at the knees and hips. Keeping your back locked in a neutral-spine position, lift using your leg muscles. Lift with your legs,  keeping your back straight. Test the weight of unknown objects before attempting to lift them. Try to keep your elbows locked down at your sides in order get the best strength from your shoulders when carrying an object.   Always ask for help when lifting heavy or awkward objects. INCORRECT LIFTING TECHNIQUES DO NOT:  Lock your knees when lifting, even if it is a small object. Bend and twist. Pivot at your feet or move your feet when needing to change directions. Assume that you can safely pick up even a paperclip without proper posture.

## 2022-01-29 NOTE — Progress Notes (Signed)
Musculoskeletal Exam ? ?Patient: Valerie Walker DOB: 03-23-1971 ? ?DOS: 01/29/2022 ? ?SUBJECTIVE: ? ?Chief Complaint:  ? ?Chief Complaint  ?Patient presents with  ? Back Pain  ? Hip Pain  ?  right  ? ? ?RASHI GRANIER is a 51 y.o.  female for evaluation and treatment of hip/back pain.  ? ?Onset:  1 month ago. Walking/jogging on treadmill and felt a pain.  ?Location: R hip flexor; also b/l lower back ?Character:  aching and sharp  ?Progression of issue:  is unchanged ?Associated symptoms: pain standing up ?No bruising, redess, swelling ?Treatment: to date has been acetaminophen.   ?Neurovascular symptoms: no ? ?Past Medical History:  ?Diagnosis Date  ? Allergy   ? Asthma   ? Diverticulitis   ? History of blood transfusion   ? Had after delivery of baby  ? Hx of blood clots 2002  ? unprovoked  ? Hypertension   ? Thyroid disease   ? ? ?Objective: ?VITAL SIGNS: BP 118/78   Pulse 61   Temp 97.8 ?F (36.6 ?C) (Oral)   Ht 5\' 4"  (1.626 m)   Wt 143 lb 4 oz (65 kg)   SpO2 99%   BMI 24.59 kg/m?  ?Constitutional: Well formed, well developed. No acute distress. ?Thorax & Lungs: No accessory muscle use ?Musculoskeletal: R hip/low back.   ?Normal active range of motion: yes.   ?Normal passive range of motion: Yes ?Tenderness to palpation: mild ttp over lumbar parasp msc b/l, no ttp over the quad at time ?Deformity: no ?Ecchymosis: no ?Tests positive: none ?Tests negative: straight leg, log roll, [place] ?Neurologic: Normal sensory function. No focal deficits noted. DTR's equal and symmetric in LE's. No clonus. 5/5 strength b/l in LE's ?Psychiatric: Normal mood. Age appropriate judgment and insight. Alert & oriented x 3.   ? ?Assessment: ? ?Strain of right quadriceps, initial encounter - Plan: meloxicam (MOBIC) 15 MG tablet ? ?Acute bilateral low back pain without sciatica - Plan: meloxicam (MOBIC) 15 MG tablet, tiZANidine (ZANAFLEX) 4 MG tablet ? ?Encounter for screening mammogram for malignant neoplasm of breast - Plan:  MM DIGITAL SCREENING BILATERAL ? ?Screen for colon cancer - Plan: Ambulatory referral to Gastroenterology ? ?Plan: ?Stretches/exercises, heat, ice, Tylenol. Send message if no improvement in 3-4 weeks. PT vs sports med referral.  ?F/u as originally scheduled. ?The patient voiced understanding and agreement to the plan. ? ? ? [place], DO ?01/29/22  ?1:40 PM ? ?

## 2022-01-31 ENCOUNTER — Telehealth (HOSPITAL_BASED_OUTPATIENT_CLINIC_OR_DEPARTMENT_OTHER): Payer: Self-pay

## 2022-02-08 ENCOUNTER — Telehealth (HOSPITAL_BASED_OUTPATIENT_CLINIC_OR_DEPARTMENT_OTHER): Payer: Self-pay

## 2022-02-09 ENCOUNTER — Ambulatory Visit (INDEPENDENT_AMBULATORY_CARE_PROVIDER_SITE_OTHER): Payer: BC Managed Care – PPO | Admitting: Family Medicine

## 2022-02-09 ENCOUNTER — Encounter: Payer: Self-pay | Admitting: Family Medicine

## 2022-02-09 VITALS — BP 128/80 | HR 69 | Temp 98.1°F | Ht 64.0 in | Wt 143.0 lb

## 2022-02-09 DIAGNOSIS — M545 Low back pain, unspecified: Secondary | ICD-10-CM | POA: Diagnosis not present

## 2022-02-09 NOTE — Progress Notes (Signed)
Chief Complaint  ?Patient presents with  ? Pain  ?  Continued ?Tylenol is keeping pain down some.  ? Hip Pain  ? ? ?Subjective: ?Patient is a 51 y.o. female here for LBP ? ?Low back pain. Started this AM. No recent inj or change in activity. No neuro s/s's. Has been doing stretches/exercises, using Tylenol/Mobic, activity as tolerated. Threw up this AM. Eating/drinking normally, no fevers, bleeding, urinary complaints, diarrhea, bruising, swelling, redness.  ? ?Past Medical History:  ?Diagnosis Date  ? Allergy   ? Asthma   ? Diverticulitis   ? History of blood transfusion   ? Had after delivery of baby  ? Hx of blood clots 2002  ? unprovoked  ? Hypertension   ? Thyroid disease   ? ? ?Objective: ?BP 128/80   Pulse 69   Temp 98.1 ?F (36.7 ?C) (Oral)   Ht 5\' 4"  (1.626 m)   Wt 143 lb (64.9 kg)   SpO2 99%   BMI 24.55 kg/m?  ?General: Awake, appears stated age ?Neuro: DTR's equal and symmetric b/l, no clonus, no cerebellar signs, mildly antalgic gait, 5/5 strength in LE's, neg straight leg b/l ?MSK: TTP over b/l lumbar parasp and SI jts, worse on R; no deformity or edema noted ?Lungs: No accessory muscle use ?Psych: Age appropriate judgment and insight, normal affect and mood ? ?Assessment and Plan: ?Acute bilateral low back pain without sciatica ? ?Heat, ice, Tylenol, LBP stretches/exercises, NSAIDs, 2 mg Zanaflex prn.  ?F/u prn.  ?The patient voiced understanding and agreement to the plan. ? ? , DO ?02/09/22  ?1:34 PM ? ? ? ? ?

## 2022-02-09 NOTE — Patient Instructions (Addendum)
Take half tab of your muscle relaxer.  ? ?Heat (pad or rice pillow in microwave) over affected area, 10-15 minutes twice daily.  ? ?Ice/cold pack over area for 10-15 min twice daily. ? ?OK to take Tylenol 1000 mg (2 extra strength tabs) or 975 mg (3 regular strength tabs) every 6 hours as needed. ? ?Ibuprofen 400-600 mg (2-3 over the counter strength tabs) every 6 hours as needed for pain. ? ?Let us know if you need anything. ? ?EXERCISES  ?RANGE OF MOTION (ROM) AND STRETCHING EXERCISES - Low Back Pain ?Most people with lower back pain will find that their symptoms get worse with excessive bending forward (flexion) or arching at the lower back (extension). The exercises that will help resolve your symptoms will focus on the opposite motion.  ?If you have pain, numbness or tingling which travels down into your buttocks, leg or foot, the goal of the therapy is for these symptoms to move closer to your back and eventually resolve. Sometimes, these leg symptoms will get better, but your lower back pain may worsen. This is often an indication of progress in your rehabilitation. Be very alert to any changes in your symptoms and the activities in which you participated in the 24 hours prior to the change. Sharing this information with your caregiver will allow him or her to most efficiently treat your condition. ?These exercises may help you when beginning to rehabilitate your injury. Your symptoms may resolve with or without further involvement from your physician, physical therapist or athletic trainer. While completing these exercises, remember:  ?Restoring tissue flexibility helps normal motion to return to the joints. This allows healthier, less painful movement and activity. ?An effective stretch should be held for at least 30 seconds. ?A stretch should never be painful. You should only feel a gentle lengthening or release in the stretched tissue. ?FLEXION RANGE OF MOTION AND STRETCHING EXERCISES: ? ?STRETCH -  Flexion, Single Knee to Chest  ?Lie on a firm bed or floor with both legs extended in front of you. ?Keeping one leg in contact with the floor, bring your opposite knee to your chest. Hold your leg in place by either grabbing behind your thigh or at your knee. ?Pull until you feel a gentle stretch in your low back. Hold 30 seconds. ?Slowly release your grasp and repeat the exercise with the opposite side. ?Repeat 2 times. Complete this exercise 3 times per week.  ? ?STRETCH - Flexion, Double Knee to Chest ?Lie on a firm bed or floor with both legs extended in front of you. ?Keeping one leg in contact with the floor, bring your opposite knee to your chest. ?Tense your stomach muscles to support your back and then lift your other knee to your chest. Hold your legs in place by either grabbing behind your thighs or at your knees. ?Pull both knees toward your chest until you feel a gentle stretch in your low back. Hold 30 seconds. ?Tense your stomach muscles and slowly return one leg at a time to the floor. ?Repeat 2 times. Complete this exercise 3 times per week.  ? ?STRETCH - Low Trunk Rotation ?Lie on a firm bed or floor. Keeping your legs in front of you, bend your knees so they are both pointed toward the ceiling and your feet are flat on the floor. ?Extend your arms out to the side. This will stabilize your upper body by keeping your shoulders in contact with the floor. ?Gently and slowly drop both knees together  to one side until you feel a gentle stretch in your low back. Hold for 30 seconds. ?Tense your stomach muscles to support your lower back as you bring your knees back to the starting position. Repeat the exercise to the other side. ?Repeat 2 times. Complete this exercise at least 3 times per week. ?  ?EXTENSION RANGE OF MOTION AND FLEXIBILITY EXERCISES: ? ?STRETCH - Extension, Prone on Elbows  ?Lie on your stomach on the floor, a bed will be too soft. Place your palms about shoulder width apart and at the  height of your head. ?Place your elbows under your shoulders. If this is too painful, stack pillows under your chest. ?Allow your body to relax so that your hips drop lower and make contact more completely with the floor. ?Hold this position for 30 seconds. ?Slowly return to lying flat on the floor. ?Repeat 2 times. Complete this exercise 3 times per week.  ? ?RANGE OF MOTION - Extension, Prone Press Ups ?Lie on your stomach on the floor, a bed will be too soft. Place your palms about shoulder width apart and at the height of your head. ?Keeping your back as relaxed as possible, slowly straighten your elbows while keeping your hips on the floor. You may adjust the placement of your hands to maximize your comfort. As you gain motion, your hands will come more underneath your shoulders. ?Hold this position 30 seconds. ?Slowly return to lying flat on the floor. ?Repeat 2 times. Complete this exercise 3 times per week.  ? ?RANGE OF MOTION- Quadruped, Neutral Spine  ?Assume a hands and knees position on a firm surface. Keep your hands under your shoulders and your knees under your hips. You may place padding under your knees for comfort. ?Drop your head and point your tailbone toward the ground below you. This will round out your lower back like an angry cat. Hold this position for 30 seconds. ?Slowly lift your head and release your tail bone so that your back sags into a large arch, like an old horse. ?Hold this position for 30 seconds. ?Repeat this until you feel limber in your low back. ?Now, find your "sweet spot." This will be the most comfortable position somewhere between the two previous positions. This is your neutral spine. Once you have found this position, tense your stomach muscles to support your low back. ?Hold this position for 30 seconds. ?Repeat 2 times. Complete this exercise 3 times per week.  ? ?STRENGTHENING EXERCISES - Low Back Sprain ?These exercises may help you when beginning to rehabilitate  your injury. These exercises should be done near your "sweet spot." This is the neutral, low-back arch, somewhere between fully rounded and fully arched, that is your least painful position. When performed in this safe range of motion, these exercises can be used for people who have either a flexion or extension based injury. These exercises may resolve your symptoms with or without further involvement from your physician, physical therapist or athletic trainer. While completing these exercises, remember:  ?Muscles can gain both the endurance and the strength needed for everyday activities through controlled exercises. ?Complete these exercises as instructed by your physician, physical therapist or athletic trainer. Increase the resistance and repetitions only as guided. ?You may experience muscle soreness or fatigue, but the pain or discomfort you are trying to eliminate should never worsen during these exercises. If this pain does worsen, stop and make certain you are following the directions exactly. If the pain is still present  after adjustments, discontinue the exercise until you can discuss the trouble with your caregiver. ? ?STRENGTHENING - Deep Abdominals, Pelvic Tilt  ?Lie on a firm bed or floor. Keeping your legs in front of you, bend your knees so they are both pointed toward the ceiling and your feet are flat on the floor. ?Tense your lower abdominal muscles to press your low back into the floor. This motion will rotate your pelvis so that your tail bone is scooping upwards rather than pointing at your feet or into the floor. ?With a gentle tension and even breathing, hold this position for 3 seconds. ?Repeat 2 times. Complete this exercise 3 times per week.  ? ?STRENGTHENING - Abdominals, Crunches  ?Lie on a firm bed or floor. Keeping your legs in front of you, bend your knees so they are both pointed toward the ceiling and your feet are flat on the floor. Cross your arms over your chest. ?Slightly tip  your chin down without bending your neck. ?Tense your abdominals and slowly lift your trunk high enough to just clear your shoulder blades. Lifting higher can put excessive stress on the lower back and does no

## 2022-02-12 ENCOUNTER — Other Ambulatory Visit: Payer: Self-pay | Admitting: Family Medicine

## 2022-02-12 ENCOUNTER — Encounter: Payer: Self-pay | Admitting: Family Medicine

## 2022-02-12 DIAGNOSIS — M545 Low back pain, unspecified: Secondary | ICD-10-CM

## 2022-02-16 ENCOUNTER — Encounter: Payer: Self-pay | Admitting: Family Medicine

## 2022-02-16 ENCOUNTER — Ambulatory Visit: Payer: BC Managed Care – PPO | Admitting: Family Medicine

## 2022-02-16 ENCOUNTER — Other Ambulatory Visit: Payer: Self-pay

## 2022-02-16 ENCOUNTER — Ambulatory Visit (HOSPITAL_BASED_OUTPATIENT_CLINIC_OR_DEPARTMENT_OTHER)
Admission: RE | Admit: 2022-02-16 | Discharge: 2022-02-16 | Disposition: A | Discharge status: Home / Self Care | Payer: BC Managed Care – PPO | Source: Ambulatory Visit | Attending: Family Medicine | Admitting: Family Medicine

## 2022-02-16 ENCOUNTER — Ambulatory Visit: Payer: Self-pay

## 2022-02-16 VITALS — BP 126/90 | Ht 64.0 in | Wt 143.0 lb

## 2022-02-16 DIAGNOSIS — S73191A Other sprain of right hip, initial encounter: Secondary | ICD-10-CM

## 2022-02-16 DIAGNOSIS — M25559 Pain in unspecified hip: Secondary | ICD-10-CM

## 2022-02-16 HISTORY — DX: Other sprain of right hip, initial encounter: S73.191A

## 2022-02-16 MED ORDER — PREDNISONE 5 MG PO TABS: ORAL_TABLET | ORAL | 0 refills | Status: DC

## 2022-02-16 NOTE — Assessment & Plan Note (Signed)
Acutely occurring.  Symptoms most consistent with the joint being origin of her pain.  Does have changes over the lateral portion of the femoral head for possible dysplasia versus cam lesion.  Appears that the labrum has been irritated as well. ?-Counseled on home exercise therapy and supportive care. ?-Prednisone. ?-X-ray. ?-Could consider injection versus physical therapy. ?

## 2022-02-16 NOTE — Patient Instructions (Signed)
Nice to meet you ?Please try ice as needed ?Please try the exercises  ?I will call with the results from today   ?Please send me a message in MyChart with any questions or updates.  ?Please see me back before your trip if any pain or in 3 weeks.  ? ?--Dr. Jordan Likes ? ?

## 2022-02-16 NOTE — Progress Notes (Signed)
?  [name] - 51 y.o. female MRN [id number]  Date of birth: 05-12-1971 ? ?SUBJECTIVE:  Including CC & ROS.  ?No chief complaint on file. ? ? ?Valerie Walker is a 51 y.o. female that is presenting with right hip pain.  The pain is severe in nature.  It started after she has been walking at the gym a few weeks ago.  No history of similar pain.  Has been trying different anti-inflammatories and muscle relaxers.  Pain radiates from the anterior hip joint to the posterior aspect.  No history of surgery. ? ?Review of the note from 3/6 shows she has provided meloxicam and Zanaflex. ?Review of the note from 3/17 shows she was counseled on anti-inflammatories and stretches. ? ? ?Review of Systems ?See HPI  ? ?HISTORY: Past Medical, Surgical, Social, and Family History Reviewed & Updated per EMR.   ?Pertinent Historical Findings include: ? ?Past Medical History:  ?Diagnosis Date  ? Allergy   ? Asthma   ? Diverticulitis   ? History of blood transfusion   ? Had after delivery of baby  ? Hx of blood clots 2002  ? unprovoked  ? Hypertension   ? Thyroid disease   ? ? ?History reviewed. No pertinent surgical history. ? ? ?PHYSICAL EXAM:  ?VS: BP 126/90 (BP Location: Left Arm, Patient Position: Sitting)   Ht 5\' 4"  (1.626 m)   Wt 143 lb (64.9 kg)   BMI 24.55 kg/m?  ?Physical Exam ?Gen: NAD, alert, cooperative with exam, well-appearing ?MSK:  ?Neurovascularly intact   ? ?Limited ultrasound: Right hip: ? ?Normal appearance of the joint in the anterior portion.  There is increased hyperemia with the surrounding musculature.   ?Normal appearance of the AIIS and ASIS. ?There does appear to be flattening of the lateral compartment of the femoral heads. ? ?Summary: Concern for labral tear versus cam lesion  ? ?Ultrasound and interpretation by , MD ? ? ? ?ASSESSMENT & PLAN:  ? ?Tear of right acetabular labrum ?Acutely occurring.  Symptoms most consistent with the joint being origin of her pain.  Does have changes over  the lateral portion of the femoral head for possible dysplasia versus cam lesion.  Appears that the labrum has been irritated as well. ?-Counseled on home exercise therapy and supportive care. ?-Prednisone. ?-X-ray. ?-Could consider injection versus physical therapy. ? ? ? ? ?

## 2022-02-19 ENCOUNTER — Telehealth: Payer: Self-pay | Admitting: Family Medicine

## 2022-02-19 NOTE — Telephone Encounter (Signed)
Left VM for patient. If she calls back please have her speak with a nurse/CMA and inform that her xrays are normal. We may need to try some physical therapy if her pain is still ongoing.  ? ?If any questions then please take the best time and phone number to call and I will try to call her back.  ? ?Myra Rude, MD ?[facility] ?02/19/2022, 5:33 PM ? ? ?

## 2022-02-20 NOTE — Telephone Encounter (Signed)
Pt informed of below.  She will call us if she wants to try PT at a later time.  ?

## 2022-03-05 ENCOUNTER — Ambulatory Visit: Payer: BC Managed Care – PPO | Admitting: Family Medicine

## 2022-03-05 ENCOUNTER — Encounter (HOSPITAL_BASED_OUTPATIENT_CLINIC_OR_DEPARTMENT_OTHER): Payer: Self-pay

## 2022-03-05 ENCOUNTER — Encounter: Payer: Self-pay | Admitting: Family Medicine

## 2022-03-05 ENCOUNTER — Ambulatory Visit (HOSPITAL_BASED_OUTPATIENT_CLINIC_OR_DEPARTMENT_OTHER)
Admission: RE | Admit: 2022-03-05 | Discharge: 2022-03-05 | Disposition: A | Discharge status: Home / Self Care | Payer: BC Managed Care – PPO | Source: Ambulatory Visit | Attending: Family Medicine | Admitting: Family Medicine

## 2022-03-05 VITALS — BP 150/83 | Ht 64.0 in | Wt 143.0 lb

## 2022-03-05 DIAGNOSIS — S73191D Other sprain of right hip, subsequent encounter: Secondary | ICD-10-CM

## 2022-03-05 DIAGNOSIS — Z1231 Encounter for screening mammogram for malignant neoplasm of breast: Secondary | ICD-10-CM | POA: Diagnosis present

## 2022-03-05 NOTE — Assessment & Plan Note (Signed)
Has been improving with less pain consistently.  She does have pain on the right hip when she loads it with carrying any weight.  X-ray was reassuring. ?-Counseled on home exercise therapy and supportive care. ?-Referral to physical therapy. ?-Could consider further imaging or injection. ?

## 2022-03-05 NOTE — Progress Notes (Signed)
?  Valerie Walker - 51 y.o. female MRN [id number]  Date of birth: [birth year] ? ?SUBJECTIVE:  Including CC & ROS.  ?No chief complaint on file. ? ? ?Valerie Walker is a 51 y.o. female that is following up for her right hip pain.  She has been doing well with no pain currently.  She did have some soreness laterally and did feel pain rating down the anterior aspect of her thigh when she was caring any luggage on the right side. ? ? ?Review of Systems ?See HPI  ? ?HISTORY: Past Medical, Surgical, Social, and Family History Reviewed & Updated per EMR.   ?Pertinent Historical Findings include: ? ?Past Medical History:  ?Diagnosis Date  ? Allergy   ? Asthma   ? Diverticulitis   ? History of blood transfusion   ? Had after delivery of baby  ? Hx of blood clots 2002  ? unprovoked  ? Hypertension   ? Thyroid disease   ? ? ?History reviewed. No pertinent surgical history. ? ? ?PHYSICAL EXAM:  ?VS: BP (!) 150/83 (BP Location: Left Arm, Patient Position: Sitting)   Ht 5\' 4"  (1.626 m)   Wt 143 lb (64.9 kg)   BMI 24.55 kg/m?  ?Physical Exam ?Gen: NAD, alert, cooperative with exam, well-appearing ?MSK:  ?Neurovascularly intact   ? ? ? ? ?ASSESSMENT & PLAN:  ? ?Tear of right acetabular labrum ?Has been improving with less pain consistently.  She does have pain on the right hip when she loads it with carrying any weight.  X-ray was reassuring. ?-Counseled on home exercise therapy and supportive care. ?-Referral to physical therapy. ?-Could consider further imaging or injection. ? ? ? ? ?

## 2022-03-05 NOTE — Patient Instructions (Signed)
Good to see you ?We'll send you to physical therapy at [facility]   ?Please send me a message in MyChart with any questions or updates.  ?Please see me back in 6-8 weeks or as need if better.  ? ?--Dr. Jordan Likes ? ?

## 2022-04-30 ENCOUNTER — Ambulatory Visit: Payer: BC Managed Care – PPO | Admitting: Family Medicine

## 2022-04-30 DIAGNOSIS — S73191D Other sprain of right hip, subsequent encounter: Secondary | ICD-10-CM

## 2022-04-30 NOTE — Progress Notes (Signed)
  Valerie Walker - 51 y.o. female MRN [id number]  Date of birth: 04/26/1971  SUBJECTIVE:  Including CC & ROS.  No chief complaint on file.   Valerie Walker is a 51 y.o. female that is following up for her right hip pain.  She has been doing well with physical therapy.  She has pain intermittently but is mild in nature.   Review of Systems See HPI   HISTORY: Past Medical, Surgical, Social, and Family History Reviewed & Updated per EMR.   Pertinent Historical Findings include:  Past Medical History:  Diagnosis Date   Allergy    Asthma    Diverticulitis    History of blood transfusion    Had after delivery of baby   Hx of blood clots 2002   unprovoked   Hypertension    Thyroid disease     No past surgical history on file.   PHYSICAL EXAM:  VS: BP 130/74   Ht 5\' 4"  (1.626 m)   Wt 145 lb (65.8 kg)   BMI 24.89 kg/m  Physical Exam Gen: NAD, alert, cooperative with exam, well-appearing MSK:  Neurovascularly intact       ASSESSMENT & PLAN:   Tear of right acetabular labrum Significant improvement with physical therapy.  Pain has improved and her function is near normal. -Counseled on home exercise therapy and supportive care. -Continue physical therapy. -Could consider further imaging if needed.

## 2022-04-30 NOTE — Assessment & Plan Note (Signed)
Significant improvement with physical therapy.  Pain has improved and her function is near normal. -Counseled on home exercise therapy and supportive care. -Continue physical therapy. -Could consider further imaging if needed.

## 2022-05-30 ENCOUNTER — Other Ambulatory Visit: Payer: Self-pay | Admitting: Family Medicine

## 2022-08-28 ENCOUNTER — Encounter: Payer: Self-pay | Admitting: Family Medicine

## 2022-08-28 ENCOUNTER — Ambulatory Visit: Payer: BC Managed Care – PPO | Admitting: Family Medicine

## 2022-08-28 VITALS — BP 120/81 | HR 92 | Temp 98.0°F | Ht 64.0 in | Wt 147.2 lb

## 2022-08-28 DIAGNOSIS — J069 Acute upper respiratory infection, unspecified: Secondary | ICD-10-CM | POA: Diagnosis not present

## 2022-08-28 DIAGNOSIS — H6993 Unspecified Eustachian tube disorder, bilateral: Secondary | ICD-10-CM

## 2022-08-28 MED ORDER — PREDNISONE 20 MG PO TABS
40.0000 mg | ORAL_TABLET | Freq: Every day | ORAL | 0 refills | Status: AC
Start: 2022-08-28 — End: 2022-09-02

## 2022-08-28 MED ORDER — BENZONATATE 200 MG PO CAPS: 200.0000 mg | ORAL_CAPSULE | Freq: Two times a day (BID) | ORAL | 0 refills | Status: DC | PRN

## 2022-08-28 MED ORDER — LEVALBUTEROL TARTRATE 45 MCG/ACT IN AERO: 1.0000 | INHALATION_SPRAY | Freq: Four times a day (QID) | RESPIRATORY_TRACT | 2 refills | Status: DC | PRN

## 2022-08-28 NOTE — Patient Instructions (Signed)
Continue to push fluids, practice good hand hygiene, and cover your mouth if you cough. ? ?If you start having fevers, shaking or shortness of breath, seek immediate care. ? ?OK to take Tylenol 1000 mg (2 extra strength tabs) or 975 mg (3 regular strength tabs) every 6 hours as needed. ? ?Let us know if you need anything. ?

## 2022-08-28 NOTE — Progress Notes (Signed)
Chief Complaint  Patient presents with   Cough    2 home Covid test Negative Sick since Thursday night. Congestion      Juanda Chance here for URI complaints.  Duration: 5 days  Associated symptoms: sinus congestion, rhinorrhea, ear pain, sore throat, and fatigue, slight cough Denies: sinus pain, itchy watery eyes, ear drainage, wheezing, myalgia, and fevers Treatment to date: Tylenol Sick contacts: No No CP or SOB.   Past Medical History:  Diagnosis Date   Allergy    Asthma    Diverticulitis    History of blood transfusion    Had after delivery of baby   Hx of blood clots 2002   unprovoked   Hypertension    Thyroid disease     Objective BP 120/81 (BP Location: Left Arm, Patient Position: Sitting, Cuff Size: Normal)   Pulse 92   Temp 98 F (36.7 C) (Oral)   Ht 5\' 4"  (1.626 m)   Wt 147 lb 4 oz (66.8 kg)   SpO2 99%   BMI 25.28 kg/m  General: Awake, alert, appears stated age HEENT: AT, [place], ears patent b/l and TM's retracted b/l, nares patent w/o discharge, pharynx pink and without exudates, MMM, no sinus ttp b/l Neck: No masses or asymmetry Heart: RRR Lungs: CTAB, no accessory muscle use Psych: Age appropriate judgment and insight, normal mood and affect  Dysfunction of both eustachian tubes - Plan: predniSONE (DELTASONE) 20 MG tablet  Viral URI with cough - Plan: benzonatate (TESSALON) 200 MG capsule  Continue to push fluids, practice good hand hygiene, cover mouth when coughing. F/u prn. If starting to experience fevers, shaking, or shortness of breath, seek immediate care. Pt voiced understanding and agreement to the plan.  [place], DO 08/28/22 1:45 PM

## 2022-09-07 ENCOUNTER — Other Ambulatory Visit: Payer: Self-pay | Admitting: Family Medicine

## 2022-09-07 ENCOUNTER — Encounter: Payer: Self-pay | Admitting: Family Medicine

## 2022-09-07 MED ORDER — AZITHROMYCIN 250 MG PO TABS: ORAL_TABLET | ORAL | 0 refills | Status: DC

## 2022-10-08 ENCOUNTER — Encounter: Payer: Self-pay | Admitting: Family Medicine

## 2022-10-12 ENCOUNTER — Ambulatory Visit: Payer: BC Managed Care – PPO | Admitting: Family Medicine

## 2022-10-15 ENCOUNTER — Ambulatory Visit: Payer: BC Managed Care – PPO | Admitting: Family Medicine

## 2022-10-15 ENCOUNTER — Encounter: Payer: Self-pay | Admitting: Family Medicine

## 2022-10-15 VITALS — BP 128/80 | HR 72 | Temp 98.2°F | Ht 64.0 in | Wt 148.1 lb

## 2022-10-15 DIAGNOSIS — I10 Essential (primary) hypertension: Secondary | ICD-10-CM

## 2022-10-15 DIAGNOSIS — R002 Palpitations: Secondary | ICD-10-CM | POA: Diagnosis not present

## 2022-10-15 DIAGNOSIS — R0789 Other chest pain: Secondary | ICD-10-CM | POA: Diagnosis not present

## 2022-10-15 DIAGNOSIS — Z13220 Encounter for screening for lipoid disorders: Secondary | ICD-10-CM

## 2022-10-15 LAB — TSH: TSH: 1.01 u[IU]/mL (ref 0.35–5.50)

## 2022-10-15 MED ORDER — OLMESARTAN MEDOXOMIL 20 MG PO TABS: 20.0000 mg | ORAL_TABLET | Freq: Every day | ORAL | 2 refills | Status: DC

## 2022-10-15 NOTE — Patient Instructions (Signed)
Give [place] 2-3 business days to get the results of your labs back.   Check your blood pressures 2-3 times per week, alternating the time of day you check it. If it is high, considering waiting 1-2 minutes and rechecking. If it gets higher, your anxiety is likely creeping up and we should avoid rechecking.   Keep the diet clean and stay active.  We will be in touch regarding your scan results.   I am not convinced we need to see cardiology at this time.  Let us know if you need anything.

## 2022-10-15 NOTE — Progress Notes (Signed)
Chief Complaint  Patient presents with   Follow-up    ED Blood pressure and heart rate     Subjective: Patient is a 51 y.o. female here for ER follow-up.  Patient had chest tightness and fluttering in her chest on 10/06/2022.  She sought care at the emergency department where her blood pressure was found to be elevated.  She was not found to have an arrhythmia, abnormal EKG, or elevated troponins.  She had chest tightness the following day but has not had any significant tightness since then.  Appetite is not fully back.  Belching seems to help the tightness, though did not during the episode that led to her going to the ED.  No new medications.  She is eating and drinking normally otherwise.  Hypertension Patient presents for hypertension follow up. She does monitor home blood pressures. Blood pressures ranging on average from 140-150's/80-90's. She is compliant with medication- Norvasc. Patient has these side effects of medication: none She is usually adhering to a healthy diet overall. Exercise: Walking  Past Medical History:  Diagnosis Date   Allergy    Asthma    Diverticulitis    History of blood transfusion    Had after delivery of baby   Hx of blood clots 2002   unprovoked   Hypertension    Thyroid disease     Objective: BP 128/80 (BP Location: Right Arm, Patient Position: Sitting, Cuff Size: Normal)   Pulse 72   Temp 98.2 F (36.8 C) (Oral)   Ht 5\' 4"  (1.626 m)   Wt 148 lb 2 oz (67.2 kg)   SpO2 99%   BMI 25.43 kg/m  General: Awake, appears stated age Heart: RRR, no LE edema Lungs: CTAB, no rales, wheezes or rhonchi. No accessory muscle use Psych: Age appropriate judgment and insight, normal affect and mood  Assessment and Plan: Palpitations - Plan: TSH  Screening cholesterol level - Plan: Lipid panel  Chest tightness - Plan: CT CARDIAC SCORING (SELF PAY ONLY)  Essential hypertension - Plan: Basic metabolic panel, olmesartan (BENICAR) 20 MG  tablet  Check thyroid levels.  If normal, she will stay hydrated and let me know if she has another episode.  Would refer to cardiology in that situation. Check cholesterol levels. Given chest tightness and her family history, will check a coronary artery calcium score. Chronic, uncontrolled.  Continue amlodipine 5 mg daily, start olmesartan 20 mg daily.  Return in 1 week for recheck BMP.  I will see her in 1 month to recheck this. The patient voiced understanding and agreement to the plan.  [place], DO 10/15/22  12:38 PM

## 2022-10-16 LAB — LIPID PANEL
Cholesterol: 220 mg/dL — ABNORMAL HIGH (ref 0–200)
HDL: 66.9 mg/dL (ref >39.00)
LDL Cholesterol: 138 mg/dL — ABNORMAL HIGH (ref 0–99)
NonHDL: 152.82
Total CHOL/HDL Ratio: 3
Triglycerides: 75 mg/dL (ref 0.0–149.0)
VLDL: 15 mg/dL (ref 0.0–40.0)

## 2022-10-22 ENCOUNTER — Other Ambulatory Visit (INDEPENDENT_AMBULATORY_CARE_PROVIDER_SITE_OTHER): Payer: BC Managed Care – PPO

## 2022-10-22 DIAGNOSIS — I10 Essential (primary) hypertension: Secondary | ICD-10-CM

## 2022-10-22 LAB — BASIC METABOLIC PANEL
BUN: 18 mg/dL (ref 6–23)
CO2: 27 mEq/L (ref 19–32)
Calcium: 9.2 mg/dL (ref 8.4–10.5)
Chloride: 102 mEq/L (ref 96–112)
Creatinine, Ser: 0.86 mg/dL (ref 0.40–1.20)
GFR: 78.54 mL/min (ref >60.00)
Glucose, Bld: 85 mg/dL (ref 70–99)
Potassium: 4.2 mEq/L (ref 3.5–5.1)
Sodium: 137 mEq/L (ref 135–145)

## 2022-10-25 ENCOUNTER — Ambulatory Visit (HOSPITAL_BASED_OUTPATIENT_CLINIC_OR_DEPARTMENT_OTHER): Payer: BC Managed Care – PPO

## 2022-10-26 ENCOUNTER — Ambulatory Visit (HOSPITAL_BASED_OUTPATIENT_CLINIC_OR_DEPARTMENT_OTHER)
Admission: RE | Admit: 2022-10-26 | Discharge: 2022-10-26 | Disposition: A | Discharge status: Home / Self Care | Payer: BC Managed Care – PPO | Source: Ambulatory Visit | Attending: Family Medicine | Admitting: Family Medicine

## 2022-10-26 DIAGNOSIS — R0789 Other chest pain: Secondary | ICD-10-CM

## 2022-11-16 ENCOUNTER — Encounter: Payer: Self-pay | Admitting: Family Medicine

## 2022-11-16 ENCOUNTER — Ambulatory Visit (INDEPENDENT_AMBULATORY_CARE_PROVIDER_SITE_OTHER): Payer: BC Managed Care – PPO | Admitting: Family Medicine

## 2022-11-16 VITALS — BP 121/71 | HR 86 | Temp 98.5°F | Ht 64.0 in | Wt 147.0 lb

## 2022-11-16 DIAGNOSIS — J029 Acute pharyngitis, unspecified: Secondary | ICD-10-CM | POA: Diagnosis not present

## 2022-11-16 MED ORDER — AZITHROMYCIN 250 MG PO TABS: ORAL_TABLET | ORAL | 0 refills | Status: DC

## 2022-11-16 NOTE — Patient Instructions (Addendum)
Continue to push fluids, practice good hand hygiene, and cover your mouth if you cough.  If you start having fevers, shaking or shortness of breath, seek immediate care.  OK to take Tylenol 1000 mg (2 extra strength tabs) or 975 mg (3 regular strength tabs) every 6 hours as needed.  Please contact the GI team at: [phone]  Let us know if you need anything.

## 2022-11-16 NOTE — Progress Notes (Signed)
Chief Complaint  Patient presents with   Sinusitis    Ear pain     Darleen Crocker here for URI complaints.  Duration: 5 days; getting worse Associated symptoms: sinus headache, sinus congestion, ear fullness, sore throat, and mild cough Denies: sinus pain, rhinorrhea, itchy watery eyes, ear pain, ear drainage, wheezing, shortness of breath, myalgia, and fevers Treatment to date: Tamiflu Sick contacts: Yes; son; covid/flu/rsv going on at school  Past Medical History:  Diagnosis Date   Allergy    Asthma    Diverticulitis    History of blood transfusion    Had after delivery of baby   Hx of blood clots 2002   unprovoked   Hypertension    Thyroid disease     Objective BP 121/71 (BP Location: Left Arm, Patient Position: Sitting, Cuff Size: Normal)   Pulse 86   Temp 98.5 F (36.9 C) (Oral)   Ht 5\' 4"  (1.626 m)   Wt 147 lb (66.7 kg)   SpO2 99%   BMI 25.23 kg/m  General: Awake, alert, appears stated age HEENT: AT, [place], ears patent b/l and TM's neg, nares patent w/o discharge, pharynx pink and without exudates, MMM Neck: tender LN's on L side of neck Heart: RRR Lungs: CTAB, no accessory muscle use Psych: Age appropriate judgment and insight, normal mood and affect  Sore throat  Given adenopathy, will tx w Zpak. She has many allergies, would've preferred Augmentin. Continue to push fluids, practice good hand hygiene, cover mouth when coughing. F/u prn. If starting to experience fevers, shaking, or shortness of breath, seek immediate care. Pt voiced understanding and agreement to the plan.  [place], DO 11/16/22 8:14 AM

## 2022-11-20 ENCOUNTER — Ambulatory Visit: Payer: BC Managed Care – PPO | Admitting: Family Medicine

## 2022-11-22 ENCOUNTER — Other Ambulatory Visit: Payer: Self-pay | Admitting: Family Medicine

## 2022-11-27 ENCOUNTER — Ambulatory Visit (INDEPENDENT_AMBULATORY_CARE_PROVIDER_SITE_OTHER): Payer: BC Managed Care – PPO | Admitting: Family Medicine

## 2022-11-27 ENCOUNTER — Encounter: Payer: Self-pay | Admitting: Family Medicine

## 2022-11-27 VITALS — BP 120/80 | HR 90 | Temp 98.2°F | Ht 64.0 in | Wt 149.0 lb

## 2022-11-27 DIAGNOSIS — J0100 Acute maxillary sinusitis, unspecified: Secondary | ICD-10-CM

## 2022-11-27 MED ORDER — PREDNISONE 20 MG PO TABS: 40.0000 mg | ORAL_TABLET | Freq: Every day | ORAL | 0 refills | Status: AC

## 2022-11-27 MED ORDER — CLINDAMYCIN HCL 300 MG PO CAPS: 300.0000 mg | ORAL_CAPSULE | Freq: Three times a day (TID) | ORAL | 0 refills | Status: AC

## 2022-11-27 NOTE — Progress Notes (Signed)
Chief Complaint  Patient presents with   Follow-up    Sinus congestion and headache     Valerie Walker here for URI complaints.  Duration: 3 days  Associated symptoms: sinus headache, sinus pain, rhinorrhea, ear fullness, and slight cough Denies: sinus congestion, itchy watery eyes, ear pain, ear drainage, sore throat, wheezing, shortness of breath, myalgia, and fevers Treatment to date: Tylenol Sick contacts: No  Past Medical History:  Diagnosis Date   Allergy    Asthma    Diverticulitis    History of blood transfusion    Had after delivery of baby   Hx of blood clots 2002   unprovoked   Hypertension    Thyroid disease     Objective BP 120/80 (BP Location: Left Arm, Patient Position: Sitting, Cuff Size: Normal)   Pulse 90   Temp 98.2 F (36.8 C) (Oral)   Ht 5\' 4"  (1.626 m)   Wt 149 lb (67.6 kg)   SpO2 99%   BMI 25.58 kg/m  General: Awake, alert, appears stated age HEENT: AT, Valerie Walker, ears patent b/l and TM's neg, nares patent w/o discharge, pharynx pink and without exudates, MMM, mild TTP over maxillary sinuses bilaterally Neck: No masses or asymmetry Heart: RRR Lungs: CTAB, no accessory muscle use Psych: Age appropriate judgment and insight, normal mood and affect  Acute maxillary sinusitis, recurrence not specified - Plan: predniSONE (DELTASONE) 20 MG tablet, clindamycin (CLEOCIN) 300 MG capsule  5-day prednisone burst 40 mg daily.  If no improvement the next few days, will start clindamycin for 7 days.  She has many drug allergies limiting reasonable options.  She will take a probiotic while she is on the antibiotic should she need it.  Continue to push fluids, practice good hand hygiene, cover mouth when coughing. F/u prn. If starting to experience fevers, shaking, or shortness of breath, seek immediate care. Pt voiced understanding and agreement to the plan.  [place], DO 11/27/22 11:44 AM

## 2022-11-27 NOTE — Patient Instructions (Addendum)
Continue to push fluids, practice good hand hygiene, and cover your mouth if you cough.  If you start having fevers, shaking or shortness of breath, seek immediate care.  OK to take Tylenol 1000 mg (2 extra strength tabs) or 975 mg (3 regular strength tabs) every 6 hours as needed.  Wait a few days before taking the clindamycin (antibiotic) and only if no better.  Consider taking Pepcid (famotidine) for reflux symptoms as needed.  Let us know if you need anything.

## 2022-12-03 ENCOUNTER — Encounter: Payer: Self-pay | Admitting: Family Medicine

## 2023-01-07 ENCOUNTER — Other Ambulatory Visit: Payer: Self-pay | Admitting: Family Medicine

## 2023-01-07 DIAGNOSIS — J329 Chronic sinusitis, unspecified: Secondary | ICD-10-CM

## 2023-01-15 ENCOUNTER — Ambulatory Visit: Payer: BC Managed Care – PPO | Admitting: Family Medicine

## 2023-01-15 ENCOUNTER — Encounter: Payer: Self-pay | Admitting: Family Medicine

## 2023-01-15 ENCOUNTER — Other Ambulatory Visit: Payer: Self-pay | Admitting: Family Medicine

## 2023-01-15 VITALS — BP 102/60 | HR 77 | Temp 98.7°F | Ht 64.0 in | Wt 149.4 lb

## 2023-01-15 DIAGNOSIS — J101 Influenza due to other identified influenza virus with other respiratory manifestations: Secondary | ICD-10-CM | POA: Diagnosis not present

## 2023-01-15 DIAGNOSIS — I10 Essential (primary) hypertension: Secondary | ICD-10-CM

## 2023-01-15 LAB — POCT INFLUENZA A/B
Influenza A, POC: NEGATIVE
Influenza B, POC: POSITIVE — AB

## 2023-01-15 LAB — POC COVID19 BINAXNOW: SARS Coronavirus 2 Ag: NEGATIVE

## 2023-01-15 NOTE — Progress Notes (Signed)
Chief Complaint  Patient presents with   Fever    Chills Some body aches Headache A little nausea     Juanda Chance here for URI complaints.  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   Past Medical History:  Diagnosis Date   Allergy    Asthma    Diverticulitis    History of blood transfusion    Had after delivery of baby   Hx of blood clots 2002   unprovoked   Hypertension    Thyroid disease     Objective BP 102/60 (BP Location: Left Arm, Patient Position: Sitting, Cuff Size: Normal)   Pulse 77   Temp 98.7 F (37.1 C) (Oral)   Ht 5' 4"$  (1.626 m)   Wt 149 lb 6 oz (67.8 kg)   SpO2 98%   BMI 25.64 kg/m  General: Awake, alert, appears stated age HEENT: AT, [place], ears patent b/l and TM's neg, nares patent w/o discharge, pharynx pink and without exudates, MMM Neck: No masses or asymmetry Heart: RRR Lungs: CTAB, no accessory muscle use Psych: Age appropriate judgment and insight, normal mood and affect  Influenza B - Plan: POC COVID-19, POCT Influenza A/B  Flu B positive today. She is outside window for antiviral. Continue to push fluids, practice good hand hygiene, cover mouth when coughing. Cont tylenol.  F/u prn. If starting to experience worsening, shaking, or shortness of breath, seek immediate care. Pt voiced understanding and agreement to the plan.  Bryant, DO 01/15/23 3:25 PM

## 2023-01-15 NOTE — Patient Instructions (Signed)
Continue to push fluids, practice good hand hygiene, and cover your mouth if you cough. ? ?If you start having fevers, shaking or shortness of breath, seek immediate care. ? ?OK to take Tylenol 1000 mg (2 extra strength tabs) or 975 mg (3 regular strength tabs) every 6 hours as needed. ? ?Let us know if you need anything. ?

## 2023-03-11 ENCOUNTER — Encounter: Payer: Self-pay | Admitting: *Deleted

## 2023-03-27 ENCOUNTER — Telehealth: Payer: BC Managed Care – PPO | Admitting: Physician Assistant

## 2023-03-27 DIAGNOSIS — J0100 Acute maxillary sinusitis, unspecified: Secondary | ICD-10-CM

## 2023-03-27 MED ORDER — PREDNISONE 20 MG PO TABS: 40.0000 mg | ORAL_TABLET | Freq: Every day | ORAL | 0 refills | Status: DC

## 2023-03-27 MED ORDER — AZITHROMYCIN 250 MG PO TABS: ORAL_TABLET | ORAL | 0 refills | Status: AC

## 2023-03-27 NOTE — Patient Instructions (Signed)
Valerie Walker, thank you for joining [name], PA-C for today's virtual visit.  While this provider is not your primary care provider (PCP), if your PCP is located in our provider database this encounter information will be shared with them immediately following your visit.   A [facility] MyChart account gives you access to today's visit and all your visits, tests, and labs performed at [facility] " click here if you don't have a [facility] MyChart account or go to [email or web address]  Consent: (Patient) Valerie Walker provided verbal consent for this virtual visit at the beginning of the encounter.  Current Medications:  Current Outpatient Medications:    amLODipine (NORVASC) 5 MG tablet, TAKE 1 TABLET (5 MG TOTAL) BY MOUTH DAILY., Disp: 90 tablet, Rfl: 1   levalbuterol (XOPENEX HFA) 45 MCG/ACT inhaler, Inhale 1-2 puffs into the lungs every 6 (six) hours as needed for wheezing or shortness of breath., Disp: 15 g, Rfl: 2   mometasone (NASONEX) 50 MCG/ACT nasal spray, PLACE 2 SPRAYS INTO THE NOSE DAILY., Disp: 17 each, Rfl: 12   olmesartan (BENICAR) 20 MG tablet, TAKE 1 TABLET BY MOUTH EVERY DAY, Disp: 30 tablet, Rfl: 2   triamcinolone cream (KENALOG) 0.1 %, Apply 1 application. topically 2 (two) times daily., Disp: 30 g, Rfl: 0   Medications ordered in this encounter:  No orders of the defined types were placed in this encounter.    *If you need refills on other medications prior to your next appointment, please contact your pharmacy*  Follow-Up: Call back or seek an in-person evaluation if the symptoms worsen or if the condition fails to improve as anticipated.  [facility] [phone]  Other Instructions Please take antibiotic as directed.  Increase fluid intake.  Use Saline nasal spray.  Take a daily multivitamin. Ok to use plain Mucinex over the counter. If symptoms are continuing, ad on the prednisone as directed.  Place a  humidifier in the bedroom.  Please call or return clinic if symptoms are not improving.  Sinusitis Sinusitis is redness, soreness, and swelling (inflammation) of the paranasal sinuses. Paranasal sinuses are air pockets within the bones of your face (beneath the eyes, the middle of the forehead, or above the eyes). In healthy paranasal sinuses, mucus is able to drain out, and air is able to circulate through them by way of your nose. However, when your paranasal sinuses are inflamed, mucus and air can become trapped. This can allow bacteria and other germs to grow and cause infection. Sinusitis can develop quickly and last only a short time (acute) or continue over a long period (chronic). Sinusitis that lasts for more than 12 weeks is considered chronic.  CAUSES  Causes of sinusitis include: Allergies. Structural abnormalities, such as displacement of the cartilage that separates your nostrils (deviated septum), which can decrease the air flow through your nose and sinuses and affect sinus drainage. Functional abnormalities, such as when the small hairs (cilia) that line your sinuses and help remove mucus do not work properly or are not present. SYMPTOMS  Symptoms of acute and chronic sinusitis are the same. The primary symptoms are pain and pressure around the affected sinuses. Other symptoms include: Upper toothache. Earache. Headache. Bad breath. Decreased sense of smell and taste. A cough, which worsens when you are lying flat. Fatigue. Fever. Thick drainage from your nose, which often is green and may contain pus (purulent). Swelling and warmth over the affected sinuses. DIAGNOSIS  Your caregiver will  perform a physical exam. During the exam, your caregiver may: Look in your nose for signs of abnormal growths in your nostrils (nasal polyps). Tap over the affected sinus to check for signs of infection. View the inside of your sinuses (endoscopy) with a special imaging device with a  light attached (endoscope), which is inserted into your sinuses. If your caregiver suspects that you have chronic sinusitis, one or more of the following tests may be recommended: Allergy tests. Nasal culture A sample of mucus is taken from your nose and sent to a lab and screened for bacteria. Nasal cytology A sample of mucus is taken from your nose and examined by your caregiver to determine if your sinusitis is related to an allergy. TREATMENT  Most cases of acute sinusitis are related to a viral infection and will resolve on their own within 10 days. Sometimes medicines are prescribed to help relieve symptoms (pain medicine, decongestants, nasal steroid sprays, or saline sprays).  However, for sinusitis related to a bacterial infection, your caregiver will prescribe antibiotic medicines. These are medicines that will help kill the bacteria causing the infection.  Rarely, sinusitis is caused by a fungal infection. In theses cases, your caregiver will prescribe antifungal medicine. For some cases of chronic sinusitis, surgery is needed. Generally, these are cases in which sinusitis recurs more than 3 times per year, despite other treatments. HOME CARE INSTRUCTIONS  Drink plenty of water. Water helps thin the mucus so your sinuses can drain more easily. Use a humidifier. Inhale steam 3 to 4 times a day (for example, sit in the bathroom with the shower running). Apply a warm, moist washcloth to your face 3 to 4 times a day, or as directed by your caregiver. Use saline nasal sprays to help moisten and clean your sinuses. Take over-the-counter or prescription medicines for pain, discomfort, or fever only as directed by your caregiver. SEEK IMMEDIATE MEDICAL CARE IF: You have increasing pain or severe headaches. You have nausea, vomiting, or drowsiness. You have swelling around your face. You have vision problems. You have a stiff neck. You have difficulty breathing. MAKE SURE YOU:  Understand  these instructions. Will watch your condition. Will get help right away if you are not doing well or get worse. Document Released: 11/12/2005 Document Revised: 02/04/2012 Document Reviewed: 11/27/2011 [facility] Patient Information 2014 [place], [place].    If you have been instructed to have an in-person evaluation today at a local Urgent Care facility, please use the link below. It will take you to a list of all of our available [facility], including address, phone number and hours of operation. Please do not delay care.  [facility]  If you or a family member do not have a primary care provider, use the link below to schedule a visit and establish care. When you choose a [facility] primary care physician or advanced practice provider, you gain a long-term partner in health. Find a Primary Care Provider  Learn more about [facility]'s in-office and virtual care options: [facility] - Get Care Now

## 2023-03-27 NOTE — Progress Notes (Signed)
Virtual Visit Consent   [name], you are scheduled for a virtual visit with a [facility] provider today. Just as with appointments in the office, your consent must be obtained to participate. Your consent will be active for this visit and any virtual visit you may have with one of our providers in the next 365 days. If you have a MyChart account, a copy of this consent can be sent to you electronically.  As this is a virtual visit, video technology does not allow for your provider to perform a traditional examination. This may limit your provider's ability to fully assess your condition. If your provider identifies any concerns that need to be evaluated in person or the need to arrange testing (such as labs, EKG, etc.), we will make arrangements to do so. Although advances in technology are sophisticated, we cannot ensure that it will always work on either your end or our end. If the connection with a video visit is poor, the visit may have to be switched to a telephone visit. With either a video or telephone visit, we are not always able to ensure that we have a secure connection.  By engaging in this virtual visit, you consent to the provision of healthcare and authorize for your insurance to be billed (if applicable) for the services provided during this visit. Depending on your insurance coverage, you may receive a charge related to this service.  I need to obtain your verbal consent now. Are you willing to proceed with your visit today? [name] has provided verbal consent on 03/27/2023 for a virtual visit (video or telephone). Valerie Walker, [place]  Date: 03/27/2023 4:31 PM  Virtual Visit via Video Note   I, Valerie Walker, connected with  Valerie Walker  ([id number], Oct 01, 1971) on 03/27/23 at  4:30 PM EDT by a video-enabled telemedicine application and verified that I am speaking with the correct person using two identifiers.  Location: Patient: Virtual Visit  Location Patient: Other: Parked Car/Mobile Provider: Virtual Visit Location Provider: Home Office   I discussed the limitations of evaluation and management by telemedicine and the availability of in person appointments. The patient expressed understanding and agreed to proceed.    History of Present Illness: Valerie Walker is a 52 y.o. who identifies as a female who was assigned female at birth, and is being seen today for possible sinusitis. Notes symptoms starting over a week and half ago. Endorses nasal congestion, sinus pressure, sinus pain. Pressure and pain is worse over maxillary sinuses. Notes thick yellow/green nasal discharge. Notes dry cough along with this. Now with low-grade fever.   OTC -- Benadryl  HPI: HPI  Problems:  Patient Active Problem List   Diagnosis Date Noted   Tear of right acetabular labrum 02/16/2022   Essential hypertension 09/01/2018   Liver lesion 09/01/2018   History of pulmonary embolus (PE) 09/01/2018    Allergies:  Allergies  Allergen Reactions   Doxycycline Palpitations    Other reaction(s): GI Intolerance Nausea/vomiting    Levaquin [Levofloxacin In D5w] Palpitations    palpitations and headaches   Penicillins Anaphylaxis    Tolerated Keflex OK   Erythromycin Hives    Severe rash and hives   Aspirin Rash   Medications:  Current Outpatient Medications:    azithromycin (ZITHROMAX) 250 MG tablet, Take 2 tablets on day 1, then 1 tablet daily on days 2 through 5, Disp: 6 tablet, Rfl: 0   predniSONE (DELTASONE) 20 MG tablet, Take  2 tablets (40 mg total) by mouth daily with breakfast., Disp: 10 tablet, Rfl: 0   amLODipine (NORVASC) 5 MG tablet, TAKE 1 TABLET (5 MG TOTAL) BY MOUTH DAILY., Disp: 90 tablet, Rfl: 1   levalbuterol (XOPENEX HFA) 45 MCG/ACT inhaler, Inhale 1-2 puffs into the lungs every 6 (six) hours as needed for wheezing or shortness of breath., Disp: 15 g, Rfl: 2   mometasone (NASONEX) 50 MCG/ACT nasal spray, PLACE 2 SPRAYS INTO  THE NOSE DAILY., Disp: 17 each, Rfl: 12   olmesartan (BENICAR) 20 MG tablet, TAKE 1 TABLET BY MOUTH EVERY DAY, Disp: 30 tablet, Rfl: 2   triamcinolone cream (KENALOG) 0.1 %, Apply 1 application. topically 2 (two) times daily., Disp: 30 g, Rfl: 0  Observations/Objective: Patient is well-developed, well-nourished in no acute distress.  Resting comfortably  at home.  Head is normocephalic, atraumatic.  No labored breathing. Speech is clear and coherent with logical content.  Patient is alert and oriented at baseline.   Assessment and Plan: 1. Acute non-recurrent maxillary sinusitis - azithromycin (ZITHROMAX) 250 MG tablet; Take 2 tablets on day 1, then 1 tablet daily on days 2 through 5  Dispense: 6 tablet; Refill: 0 - predniSONE (DELTASONE) 20 MG tablet; Take 2 tablets (40 mg total) by mouth daily with breakfast.  Dispense: 10 tablet; Refill: 0  Rx Azithromycin.  Increase fluids.  Rest.  Saline nasal spray.  Probiotic.  Mucinex as directed.  Humidifier in bedroom. Prednisone per orders.  Call or return to clinic if symptoms are not improving.   Follow Up Instructions: I discussed the assessment and treatment plan with the patient. The patient was provided an opportunity to ask questions and all were answered. The patient agreed with the plan and demonstrated an understanding of the instructions.  A copy of instructions were sent to the patient via MyChart unless otherwise noted below.   The patient was advised to call back or seek an in-person evaluation if the symptoms worsen or if the condition fails to improve as anticipated.  Time:  I spent 10 minutes with the patient via telehealth technology discussing the above problems/concerns.    Valerie Climes, PA-C

## 2023-04-13 ENCOUNTER — Other Ambulatory Visit: Payer: Self-pay | Admitting: Family Medicine

## 2023-04-13 DIAGNOSIS — I10 Essential (primary) hypertension: Secondary | ICD-10-CM

## 2023-05-19 ENCOUNTER — Other Ambulatory Visit: Payer: Self-pay | Admitting: Family Medicine

## 2023-05-21 ENCOUNTER — Ambulatory Visit: Payer: BC Managed Care – PPO | Admitting: Family Medicine

## 2023-05-21 VITALS — BP 118/68 | HR 63 | Temp 98.5°F | Ht 64.0 in | Wt 145.4 lb

## 2023-05-21 DIAGNOSIS — R531 Weakness: Secondary | ICD-10-CM

## 2023-05-21 DIAGNOSIS — R55 Syncope and collapse: Secondary | ICD-10-CM | POA: Diagnosis not present

## 2023-05-21 DIAGNOSIS — D72829 Elevated white blood cell count, unspecified: Secondary | ICD-10-CM | POA: Diagnosis not present

## 2023-05-21 DIAGNOSIS — R3129 Other microscopic hematuria: Secondary | ICD-10-CM | POA: Diagnosis not present

## 2023-05-21 DIAGNOSIS — Z1211 Encounter for screening for malignant neoplasm of colon: Secondary | ICD-10-CM

## 2023-05-21 LAB — CBC WITH DIFFERENTIAL/PLATELET
Basophils Absolute: 0.1 10*3/uL (ref 0.0–0.1)
Basophils Relative: 0.7 % (ref 0.0–3.0)
Eosinophils Absolute: 0.2 10*3/uL (ref 0.0–0.7)
Eosinophils Relative: 1.7 % (ref 0.0–5.0)
HCT: 39.9 % (ref 36.0–46.0)
Hemoglobin: 12.6 g/dL (ref 12.0–15.0)
Lymphocytes Relative: 22.2 % (ref 12.0–46.0)
Lymphs Abs: 2.5 10*3/uL (ref 0.7–4.0)
MCHC: 31.6 g/dL (ref 30.0–36.0)
MCV: 89 fl (ref 78.0–100.0)
Monocytes Absolute: 0.6 10*3/uL (ref 0.1–1.0)
Monocytes Relative: 5.8 % (ref 3.0–12.0)
Neutro Abs: 7.7 10*3/uL (ref 1.4–7.7)
Neutrophils Relative %: 69.6 % (ref 43.0–77.0)
Platelets: 429 10*3/uL — ABNORMAL HIGH (ref 150.0–400.0)
RBC: 4.49 Mil/uL (ref 3.87–5.11)
RDW: 13 % (ref 11.5–15.5)
WBC: 11.1 10*3/uL — ABNORMAL HIGH (ref 4.0–10.5)

## 2023-05-21 LAB — URINALYSIS, MICROSCOPIC ONLY

## 2023-05-21 LAB — T4, FREE: Free T4: 0.97 ng/dL (ref 0.60–1.60)

## 2023-05-21 LAB — TSH: TSH: 0.65 u[IU]/mL (ref 0.35–5.50)

## 2023-05-21 NOTE — Progress Notes (Signed)
Chief Complaint  Patient presents with   Follow-up    ER    Subjective: Patient is a 52 y.o. female here for ER follow-up.  The patient was in the emergency department on 05/18/2023 at [facility] for syncope.  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  She felt fine.  The next thing she knew, she had difficulty spelling a word and reportedly looked pale. The next thing she knew, she told someone she felt sick and then felt her vision go dark and fell down.  Fortunately someone was there to catch her and laid her down.  She did urinate herself.  She has no history of seizures.  There is no reports of any convulsions, unintentional defecation, or tongue biting.  She does not reportedly have a postictal state.  Workup in the ER was largely unremarkable with the exception of slightly decreased hemoglobin and microscopic hematuria.  She had a leukocytosis that was significant as well.  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  She did not eat very much and denies being well-hydrated that day.  Past Medical History:  Diagnosis Date   Allergy    Asthma    Diverticulitis    History of blood transfusion    Had after delivery of baby   Hx of blood clots 2002   unprovoked   Hypertension    Thyroid disease     Objective: BP 118/68 (BP Location: Left Arm, Patient Position: Sitting, Cuff Size: Normal)   Pulse 63   Temp 98.5 F (36.9 C) (Oral)   Ht 5\' 4"  (1.626 m)   Wt 145 lb 6 oz (65.9 kg)   SpO2 98%   BMI 24.95 kg/m  General: Awake, appears stated age Heart: RRR, no LE edema, no bruits Lungs: CTAB, no rales, wheezes or rhonchi. No accessory muscle use Neuro: DTRs equal and symmetric throughout, 5/5 strength throughout, gait is normal, no cerebellar signs, CN II through XII grossly intact Psych: Age appropriate judgment and insight, normal affect and mood  Assessment  and Plan: Leukocytosis, unspecified type - Plan: CBC w/Diff  Microscopic hematuria - Plan: Urine Microscopic Only  Syncope, unspecified syncope type  Screen for colon cancer - Plan: Ambulatory referral to Gastroenterology  Weakness - Plan: TSH, T4, free  Follow-up on CBC with differential.  May need to get a pathology smear review. Follow-up on her urine.  Is still present with blood, would recheck in 2 weeks and potentially refer to urology. Syncope sounds vasovagal in nature.  She did urinate herself but no other signs of seizure activity.  Lasted for less than a minute.  No postictal state.  No convulsions.  She did have dehydration, poor oral intake, and was recovering from sickness. Refer to gastroenterology. Follow-up with TSH and free T4.  Likely improving and will continue to do so. The patient voiced understanding and agreement to the plan.  I spent 35 minutes with the patient discussing the above plans in addition to reviewing her chart/recent ER visit on the same date of visit.  Jilda Roche Woodson, DO 05/21/23  4:27 PM

## 2023-05-21 NOTE — Patient Instructions (Signed)
Call Center for [facility] at [facility] at [phone] for an appointment.  They are located at [address], [place], [place], [address] (right across the hall from our office).  If you do not hear anything about your referral in the next 1-2 weeks, call our office and ask for an update.  Give [place] 2-3 business days to get the results of your labs back.   Let us know if you need anything.

## 2023-08-07 ENCOUNTER — Other Ambulatory Visit: Payer: Self-pay | Admitting: Family Medicine

## 2023-08-07 DIAGNOSIS — I10 Essential (primary) hypertension: Secondary | ICD-10-CM

## 2023-09-25 ENCOUNTER — Encounter (INDEPENDENT_AMBULATORY_CARE_PROVIDER_SITE_OTHER): Payer: BC Managed Care – PPO | Admitting: Family Medicine

## 2023-09-25 DIAGNOSIS — J329 Chronic sinusitis, unspecified: Secondary | ICD-10-CM

## 2023-09-26 MED ORDER — AZITHROMYCIN 250 MG PO TABS: ORAL_TABLET | ORAL | 0 refills | Status: DC

## 2023-09-26 MED ORDER — FLUCONAZOLE 150 MG PO TABS: ORAL_TABLET | ORAL | 0 refills | Status: DC

## 2023-09-26 NOTE — Telephone Encounter (Signed)
Please see the MyChart message reply(ies) for my assessment and plan.  The patient gave consent for this Medical Advice Message and is aware that it may result in a bill to their insurance company as well as the possibility that this may result in a co-payment or deductible. They are an established patient, but are not seeking medical advice exclusively about a problem treated during an in person or video visit in the last 7 days. I did not recommend an in person or video visit within 7 days of my reply.  I spent a total of 6 minutes cumulative time within 7 days through MyChart messaging Karon Heckendorn Paul Hairo Garraway, DO  

## 2023-10-14 ENCOUNTER — Emergency Department (HOSPITAL_BASED_OUTPATIENT_CLINIC_OR_DEPARTMENT_OTHER): Payer: BC Managed Care – PPO

## 2023-10-14 ENCOUNTER — Encounter (HOSPITAL_BASED_OUTPATIENT_CLINIC_OR_DEPARTMENT_OTHER): Payer: Self-pay | Admitting: Emergency Medicine

## 2023-10-14 ENCOUNTER — Other Ambulatory Visit: Payer: Self-pay

## 2023-10-14 ENCOUNTER — Emergency Department (HOSPITAL_BASED_OUTPATIENT_CLINIC_OR_DEPARTMENT_OTHER)
Admission: EM | Admit: 2023-10-14 | Discharge: 2023-10-14 | Disposition: A | Discharge status: Home / Self Care | Payer: BC Managed Care – PPO | Attending: Emergency Medicine | Admitting: Emergency Medicine

## 2023-10-14 DIAGNOSIS — I10 Essential (primary) hypertension: Secondary | ICD-10-CM | POA: Insufficient documentation

## 2023-10-14 DIAGNOSIS — S60470A Other superficial bite of right index finger, initial encounter: Secondary | ICD-10-CM | POA: Diagnosis present

## 2023-10-14 DIAGNOSIS — J45909 Unspecified asthma, uncomplicated: Secondary | ICD-10-CM | POA: Diagnosis not present

## 2023-10-14 DIAGNOSIS — Z23 Encounter for immunization: Secondary | ICD-10-CM | POA: Insufficient documentation

## 2023-10-14 DIAGNOSIS — M79644 Pain in right finger(s): Secondary | ICD-10-CM | POA: Insufficient documentation

## 2023-10-14 DIAGNOSIS — W540XXA Bitten by dog, initial encounter: Secondary | ICD-10-CM | POA: Insufficient documentation

## 2023-10-14 MED ORDER — METRONIDAZOLE 500 MG PO TABS: 500.0000 mg | ORAL_TABLET | Freq: Two times a day (BID) | ORAL | 0 refills | Status: DC

## 2023-10-14 MED ORDER — METRONIDAZOLE 500 MG PO TABS
500.0000 mg | ORAL_TABLET | Freq: Once | ORAL | Status: AC
  Administered 2023-10-14: 500 mg via ORAL
  Filled 2023-10-14: qty 1

## 2023-10-14 MED ORDER — TETANUS-DIPHTH-ACELL PERTUSSIS 5-2.5-18.5 LF-MCG/0.5 IM SUSY
0.5000 mL | PREFILLED_SYRINGE | Freq: Once | INTRAMUSCULAR | Status: AC
  Administered 2023-10-14: 0.5 mL via INTRAMUSCULAR
  Filled 2023-10-14: qty 0.5

## 2023-10-14 MED ORDER — SULFAMETHOXAZOLE-TRIMETHOPRIM 800-160 MG PO TABS
1.0000 | ORAL_TABLET | Freq: Once | ORAL | Status: AC
  Administered 2023-10-14: 1 via ORAL
  Filled 2023-10-14: qty 1

## 2023-10-14 MED ORDER — SULFAMETHOXAZOLE-TRIMETHOPRIM 800-160 MG PO TABS: 1.0000 | ORAL_TABLET | Freq: Two times a day (BID) | ORAL | 0 refills | Status: AC

## 2023-10-14 NOTE — ED Provider Notes (Signed)
[facility] Provider Note  CSN: [id number] Arrival date & time: 10/14/23 2132  Chief Complaint(s) Animal Bite  HPI Valerie Walker is a 52 y.o. female with past medical history as below, significant for asthma, diverticulitis, hypertension, thyroid disease who presents to the ED with complaint of dog bite to right index finger  She reports that she was bitten by her dog just prior to arrival, she is trying to retrieve a coin at the dog's mouth.  The dog bit her right index finger.  She is RHD.  Tetanus is out of date.  She irrigated the wound copiously following the wound.  Past Medical History Past Medical History:  Diagnosis Date   Allergy    Asthma    Diverticulitis    History of blood transfusion    Had after delivery of baby   Hx of blood clots 2002   unprovoked   Hypertension    Thyroid disease    Patient Active Problem List   Diagnosis Date Noted   Tear of right acetabular labrum 02/16/2022   Essential hypertension 09/01/2018   Liver lesion 09/01/2018   History of pulmonary embolus (PE) 09/01/2018   Home Medication(s) Prior to Admission medications   Medication Sig Start Date End Date Taking? Authorizing Provider  azithromycin (ZITHROMAX) 250 MG tablet Take 2 tabs the first day and then 1 tab daily until you run out. 09/26/23   Sharlene Dory, DO  fluconazole (DIFLUCAN) 150 MG tablet Take 1 tab, repeat in 72 hours if no improvement. 09/26/23   Sharlene Dory, DO  levalbuterol [facility] HFA) 45 MCG/ACT inhaler Inhale 1-2 puffs into the lungs every 6 (six) hours as needed for wheezing or shortness of breath. 08/28/22   Wendling, Jilda Roche, DO  mometasone (NASONEX) 50 MCG/ACT nasal spray PLACE 2 SPRAYS INTO THE NOSE DAILY. 01/07/23   Sharlene Dory, DO  olmesartan (BENICAR) 20 MG tablet TAKE 1 TABLET BY MOUTH EVERY DAY 08/07/23   Sharlene Dory, DO  triamcinolone cream (KENALOG) 0.1 %  Apply 1 application. topically 2 (two) times daily. 01/29/22   Sharlene Dory, DO                                                                                                                                    Past Surgical History History reviewed. No pertinent surgical history. Family History Family History  Problem Relation Age of Onset   Asthma Mother    Hypertension Mother    Kidney disease Mother    Cancer Father        prostate   Hypertension Father    Hypertension Brother    Asthma Son    Asthma Maternal Grandmother    Heart disease Maternal Grandfather    Heart attack Maternal Grandfather    Heart attack Paternal Grandmother    Heart disease Paternal Grandmother    Alcohol  abuse Paternal Grandfather    Early death Paternal Grandfather        due to alcohol    Social History Social History   Tobacco Use   Smoking status: Never   Smokeless tobacco: Never  Substance Use Topics   Alcohol use: Never   Drug use: Never   Allergies Doxycycline, Levaquin [levofloxacin in d5w], Penicillins, Erythromycin, and Aspirin  Review of Systems Review of Systems  Physical Exam Vital Signs  I have reviewed the triage vital signs BP 133/79 (BP Location: Left Arm)   Pulse 69   Temp 98.1 F (36.7 C)   Resp 18   Ht 5\' 4"  (1.626 m)   Wt 63.5 kg   SpO2 100%   BMI 24.03 kg/m  Physical Exam  ED Results and Treatments Labs (all labs ordered are listed, but only abnormal results are displayed) Labs Reviewed - No data to display                                                                                                                        Radiology No results found.  Pertinent labs & imaging results that were available during my care of the patient were reviewed by me and considered in my medical decision making (see MDM for details).  Medications Ordered in ED Medications  metroNIDAZOLE (FLAGYL) tablet 500 mg (has no administration in time range)   sulfamethoxazole-trimethoprim (BACTRIM DS) 800-160 MG per tablet 1 tablet (has no administration in time range)  Tdap (BOOSTRIX) injection 0.5 mL (has no administration in time range)                                                                                                                                     Procedures Procedures  (including critical care time)  Medical Decision Making / ED Course    Medical Decision Making:    Valerie Walker is a 52 y.o. female ***. The complaint involves an extensive differential diagnosis and also carries with it a high risk of complications and morbidity.  Serious etiology was considered. Ddx includes but is not limited to: ***  Complete initial physical exam performed, notably the patient  was ***.    Reviewed and confirmed nursing documentation for past medical history, family history, social history.  Vital signs reviewed.    Clinical Course as of 10/14/23 2227  Mon Oct 14, 2023  2226  She has allergy to amoxicillin, penicillin, doxycycline.  Will give Flagyl and Bactrim.  Update tetanus. [SG]    Clinical Course User Index [SG] Sloan Leiter, DO     ***                 Additional history obtained: -Additional history obtained from {wsadditionalhistorian:28072} -External records from outside source obtained and reviewed including: Chart review including previous notes, labs, imaging, consultation notes including  ***   Lab Tests: -I ordered, reviewed, and interpreted labs.   The pertinent results include:   Labs Reviewed - No data to display  Notable for ***  EKG   EKG Interpretation Date/Time:    Ventricular Rate:    PR Interval:    QRS Duration:    QT Interval:    QTC Calculation:   R Axis:      Text Interpretation:           Imaging Studies ordered: I ordered imaging studies including *** I independently visualized the following imaging with scope of interpretation limited to determining  acute life threatening conditions related to emergency care; findings noted above I independently visualized and interpreted imaging. I agree with the radiologist interpretation   Medicines ordered and prescription drug management: Meds ordered this encounter  Medications   metroNIDAZOLE (FLAGYL) tablet 500 mg   sulfamethoxazole-trimethoprim (BACTRIM DS) 800-160 MG per tablet 1 tablet   Tdap (BOOSTRIX) injection 0.5 mL    -I have reviewed the patients home medicines and have made adjustments as needed   Consultations Obtained: I requested consultation with the ***,  and discussed lab and imaging findings as well as pertinent plan - they recommend: ***   Cardiac Monitoring: The patient was maintained on a cardiac monitor.  I personally viewed and interpreted the cardiac monitored which showed an underlying rhythm of: *** Continuous pulse oximetry interpreted by myself, ***% on ***.    Social Determinants of Health:  Diagnosis or treatment significantly limited by social determinants of health: {wssoc:28071}   Reevaluation: After the interventions noted above, I reevaluated the patient and found that they have {resolved/improved/worsened:23923::"improved"}  Co morbidities that complicate the patient evaluation  Past Medical History:  Diagnosis Date   Allergy    Asthma    Diverticulitis    History of blood transfusion    Had after delivery of baby   Hx of blood clots 2002   unprovoked   Hypertension    Thyroid disease       Dispostion: Disposition decision including need for hospitalization was considered, and patient {wsdispo:28070::"discharged from emergency department."}    Final Clinical Impression(s) / ED Diagnoses Final diagnoses:  None

## 2023-10-14 NOTE — ED Triage Notes (Signed)
Patient reports dog bit her right index finger while she was trying to get something out of his mouth.  Patient reports dog is UTD on rabies vaccines, patient is not UTD on tetanus.  Patient reports pain to finger and base of finger at hand, as well as 5 small lacerations, bleeding controlled.

## 2023-10-14 NOTE — Discharge Instructions (Addendum)
Please keep your wound clean and dry.  Keep the original dressings off next 24 hours and you can replace them with new clean dressing.  Wash with gentle soap and water twice a day.  Follow-up with PCP for recheck in the next 5 days.  Return for any worsening symptoms or worrisome symptoms including but not limited to worsening swelling, worsening pain, pus coming out of the wound, inability move the finger, etc.  It was a pleasure caring for you today in the emergency department.  Please return to the emergency department for any worsening or worrisome symptoms.

## 2023-10-16 ENCOUNTER — Encounter: Payer: Self-pay | Admitting: Family Medicine

## 2023-11-13 IMAGING — MG MM DIGITAL SCREENING BILAT W/ TOMO AND CAD
4 of 9 series · 4 of 29 positions shown · non-contrast
Comparison: None.

CLINICAL DATA: Screening.

EXAM:
DIGITAL SCREENING BILATERAL MAMMOGRAM WITH TOMOSYNTHESIS AND CAD
TECHNIQUE: Bilateral screening digital craniocaudal and mediolateral oblique
mammograms were obtained. Bilateral screening digital breast
tomosynthesis was performed. The images were evaluated with
computer-aided detection.

[L CC synth-2D]
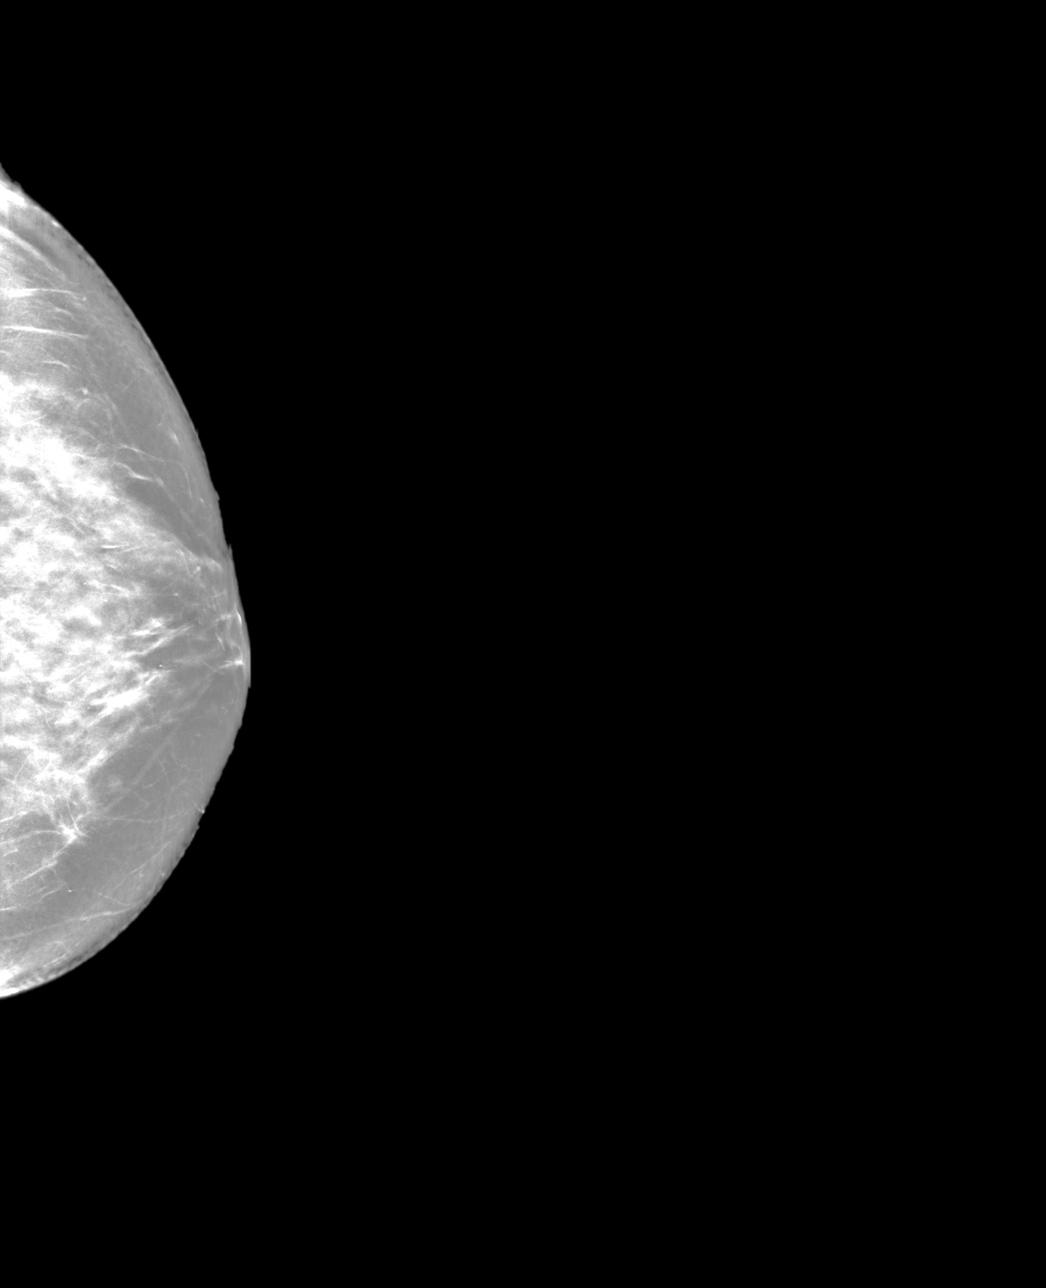

[R MLO synth-2D]
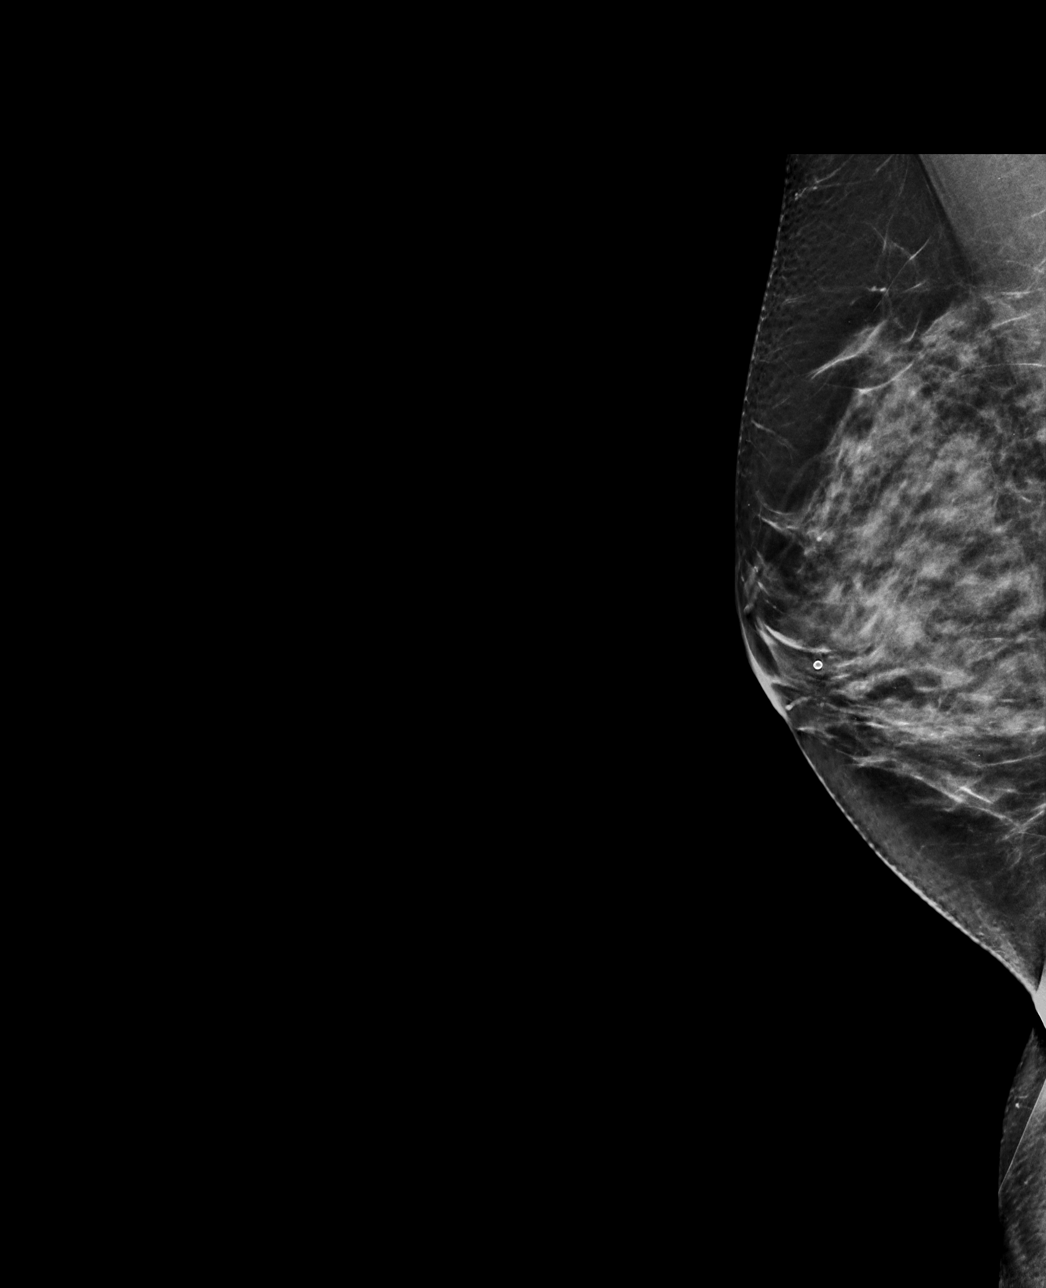

[R CC synth-2D]
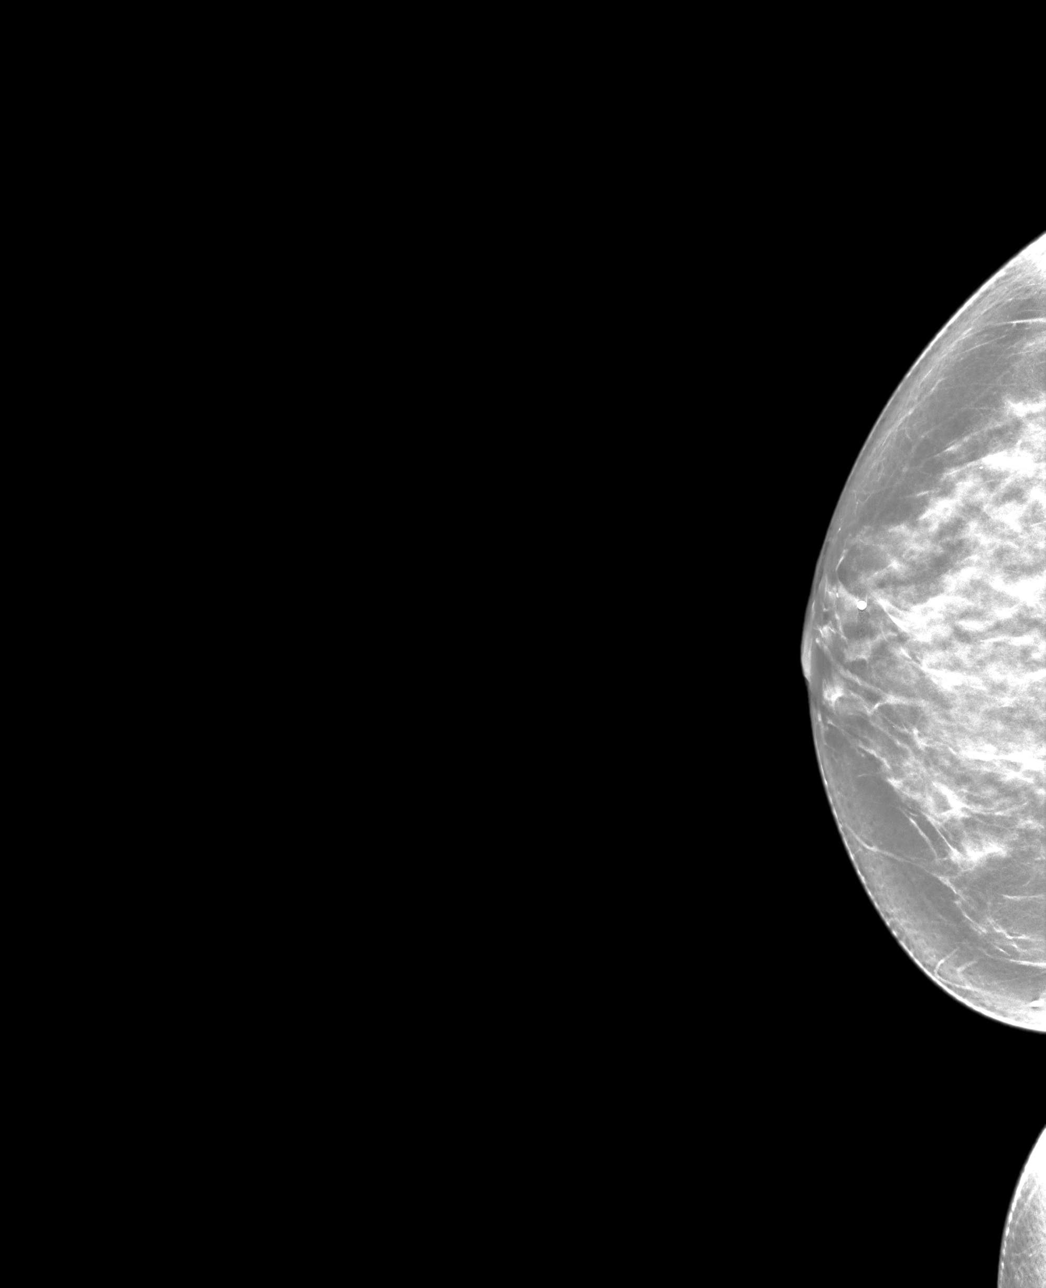

[L MLO synth-2D]
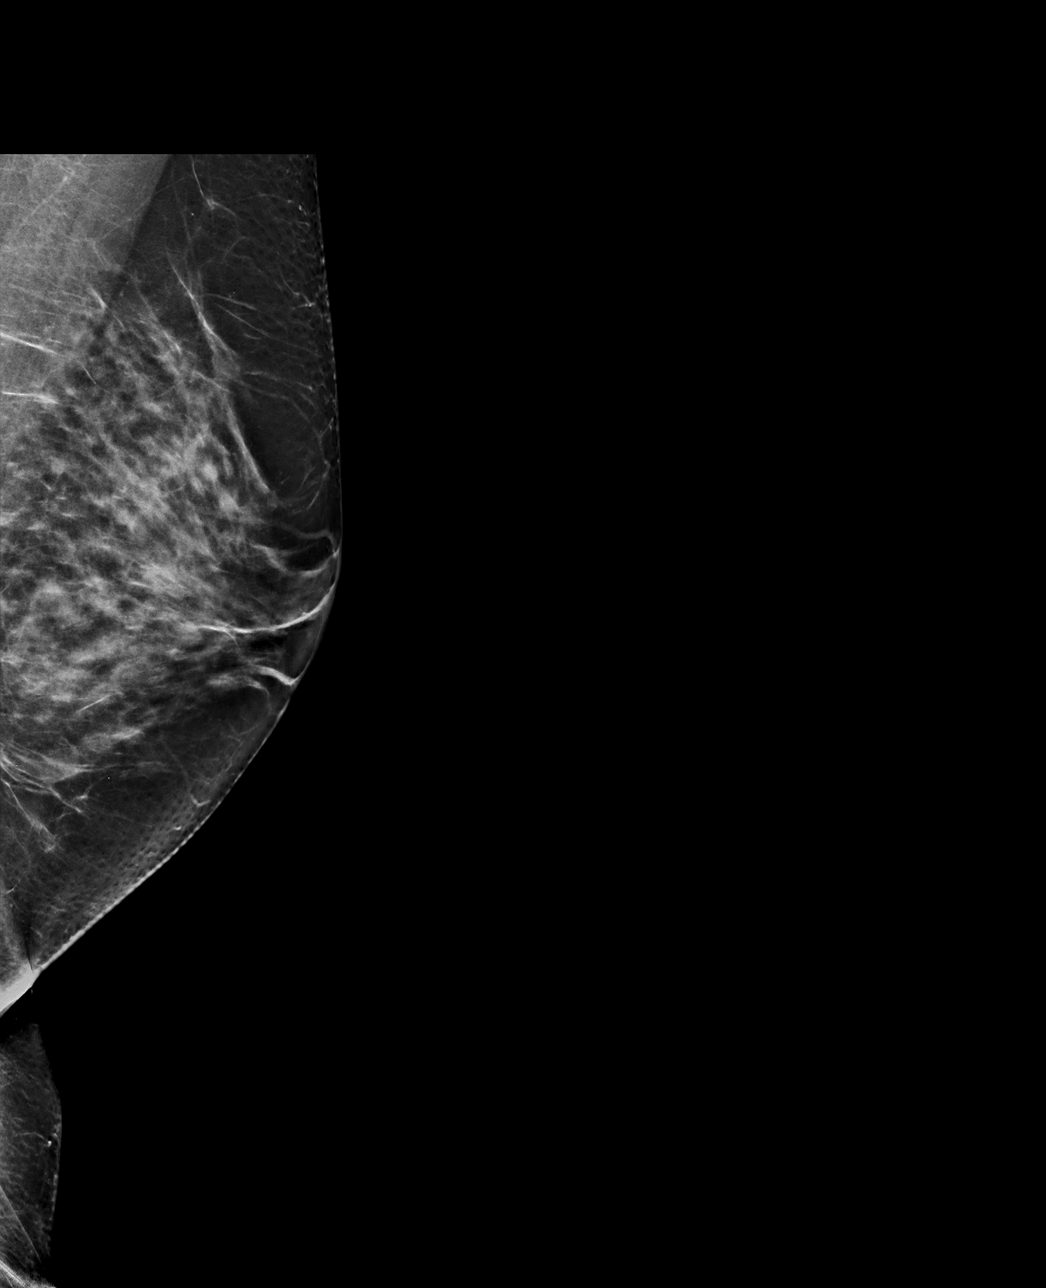

[4 of 29 positions shown; findings below may reference images not displayed]

ACR Breast Density Category d: The breast tissue is extremely dense,
which lowers the sensitivity of mammography.
FINDINGS: There are no findings suspicious for malignancy.
IMPRESSION: No mammographic evidence of malignancy. A result letter of this
screening mammogram will be mailed directly to the patient.

RECOMMENDATION:
Screening mammogram in one year. (Code:W3-Z-XIN)

BI-RADS CATEGORY  1: Negative.

## 2023-11-15 ENCOUNTER — Ambulatory Visit: Payer: BC Managed Care – PPO | Admitting: Family Medicine

## 2023-11-15 ENCOUNTER — Encounter: Payer: Self-pay | Admitting: Family Medicine

## 2023-11-15 VITALS — BP 114/78 | HR 73 | Temp 98.3°F | Resp 16 | Ht 64.0 in | Wt 146.8 lb

## 2023-11-15 DIAGNOSIS — W540XXA Bitten by dog, initial encounter: Secondary | ICD-10-CM

## 2023-11-15 DIAGNOSIS — R2231 Localized swelling, mass and lump, right upper limb: Secondary | ICD-10-CM

## 2023-11-15 MED ORDER — CLINDAMYCIN HCL 300 MG PO CAPS: 300.0000 mg | ORAL_CAPSULE | Freq: Three times a day (TID) | ORAL | 0 refills | Status: DC

## 2023-11-15 MED ORDER — CIPROFLOXACIN HCL 500 MG PO TABS: 500.0000 mg | ORAL_TABLET | Freq: Two times a day (BID) | ORAL | 0 refills | Status: DC

## 2023-11-15 NOTE — Progress Notes (Signed)
Established Patient Office Visit  Subjective   Patient ID: Valerie Walker, female    DOB: Apr 24, 1971  Age: 52 y.o. MRN: [id number]  Chief Complaint  Patient presents with   Animal Bite   Follow-up    HPI   Discussed the use of AI scribe software for clinical note transcription with the patient, who gave verbal consent to proceed.  History of Present Illness   The patient, with a history of blood clots and sinus infections, presents with a dog bite to the hand that occurred approximately two weeks ago. The bite, initially severe and swollen, has since healed significantly. However, the patient reports persistent swelling around the knuckle and a tingling sensation. She is unable to bend the affected finger due to the swelling.  The patient was initially prescribed two antibiotics, but experienced severe adverse reactions to both, including constant nausea, vomiting, hives, and a sensation of 'things crawling through her head.' She has a known allergy to sulfa drugs from her youth, which she thought she had outgrown. The patient has previously tolerated Z-Pak well for sinus infections.  The patient also has a history of severe skin allergies, for which she takes prednisone. She reports a previous hypersensitivity reaction to Levaquin, manifesting as a racing heart. The patient has a history of diverticulitis, for which she was previously treated with Flagyl and Cipro. --- The dog has the severe skin allergies, no the patient     The patient, with a history of blood clots and sinus infections, presents with a dog bite to the hand that occurred approximately two weeks ago. The bite, initially severe and swollen, has since healed significantly. However, the patient reports persistent swelling around the knuckle and a tingling sensation. She is unable to bend the affected finger due to the swelling.  The patient was initially prescribed two antibiotics, but experienced severe adverse reactions  to both, including constant nausea, vomiting, hives, and a sensation of 'things crawling through her head.' She has a known allergy to sulfa drugs from her youth, which she thought she had outgrown. The patient has previously tolerated Z-Pak well for sinus infections.  The patient also has a history of severe skin allergies, for which she takes prednisone. She reports a previous hypersensitivity reaction to Levaquin, manifesting as a racing heart. The patient has a history of diverticulitis, for which she was previously treated with Flagyl and Cipro.       Patient Active Problem List   Diagnosis Date Noted   Tear of right acetabular labrum 02/16/2022   Essential hypertension 09/01/2018   Liver lesion 09/01/2018   History of pulmonary embolus (PE) 09/01/2018   Past Medical History:  Diagnosis Date   Allergy    Asthma    Diverticulitis    History of blood transfusion    Had after delivery of baby   Hx of blood clots 2002   unprovoked   Hypertension    Thyroid disease    No past surgical history on file. Social History   Tobacco Use   Smoking status: Never   Smokeless tobacco: Never  Substance Use Topics   Alcohol use: Never   Drug use: Never   Social History   Socioeconomic History   Marital status: Married    Spouse name: Not on file   Number of children: Not on file   Years of education: Not on file   Highest education level: Associate degree: occupational, Scientist, product/process development, or vocational program  Occupational History   Not  on file  Tobacco Use   Smoking status: Never   Smokeless tobacco: Never  Substance and Sexual Activity   Alcohol use: Never   Drug use: Never   Sexual activity: Not on file  Other Topics Concern   Not on file  Social History Narrative   Not on file   Social Drivers of Health   Financial Resource Strain: Low Risk  (11/15/2023)   Overall Financial Resource Strain (CARDIA)    Difficulty of Paying Living Expenses: Not hard at all  Food  Insecurity: No Food Insecurity (11/15/2023)   Hunger Vital Sign    Worried About Running Out of Food in the Last Year: Never true    Ran Out of Food in the Last Year: Never true  Transportation Needs: No Transportation Needs (05/21/2023)   PRAPARE - Administrator, Civil Service (Medical): No    Lack of Transportation (Non-Medical): No  Physical Activity: Insufficiently Active (11/15/2023)   Exercise Vital Sign    Days of Exercise per Week: 1 day    Minutes of Exercise per Session: 40 min  Stress: No Stress Concern Present (11/15/2023)   [employer] of Occupational Health - Occupational Stress Questionnaire    Feeling of Stress : Only a little  Social Connections: Socially Integrated (11/15/2023)   Social Connection and Isolation Panel [NHANES]    Frequency of Communication with Friends and Family: More than three times a week    Frequency of Social Gatherings with Friends and Family: More than three times a week    Attends Religious Services: 1 to 4 times per year    Active Member of [employer] or Organizations: Yes    Attends Engineer, structural: More than 4 times per year    Marital Status: Married  Catering manager Violence: Not on file   Family Status  Relation Name Status   Mother  Alive   Father  Alive   Brother  Alive   Son  Alive   MGM  Deceased   MGF  Deceased   PGM  Deceased   PGF  Deceased  No partnership data on file   Family History  Problem Relation Age of Onset   Asthma Mother    Hypertension Mother    Kidney disease Mother    Cancer Father        prostate   Hypertension Father    Hypertension Brother    Asthma Son    Asthma Maternal Grandmother    Heart disease Maternal Grandfather    Heart attack Maternal Grandfather    Heart attack Paternal Grandmother    Heart disease Paternal Grandmother    Alcohol abuse Paternal Grandfather    Early death Paternal Grandfather        due to alcohol   Allergies  Allergen Reactions    Doxycycline Palpitations    Other reaction(s): GI Intolerance Nausea/vomiting    Levaquin [Levofloxacin In D5w] Palpitations    palpitations and headaches   Penicillins Anaphylaxis    Tolerated Keflex OK   Erythromycin Hives    Severe rash and hives   Bactrim [Sulfamethoxazole-Trimethoprim] Hives   Flagyl [Metronidazole] Hives   Aspirin Rash      Review of Systems  Constitutional:  Negative for fever and malaise/fatigue.  HENT:  Negative for congestion.   Eyes:  Negative for blurred vision.  Respiratory:  Negative for shortness of breath.   Cardiovascular:  Negative for chest pain, palpitations and leg swelling.  Gastrointestinal:  Negative for  abdominal pain, blood in stool and nausea.  Genitourinary:  Negative for dysuria and frequency.  Musculoskeletal:  Positive for joint pain. Negative for falls.  Skin:  Negative for rash.  Neurological:  Negative for dizziness, loss of consciousness and headaches.  Endo/Heme/Allergies:  Negative for environmental allergies.  Psychiatric/Behavioral:  Negative for depression. The patient is not nervous/anxious.       Objective:     BP 114/78 (BP Location: Left Arm, Patient Position: Sitting, Cuff Size: Normal)   Pulse 73   Temp 98.3 F (36.8 C) (Oral)   Resp 16   Ht 5\' 4"  (1.626 m)   Wt 146 lb 12.8 oz (66.6 kg)   SpO2 98%   BMI 25.20 kg/m  BP Readings from Last 3 Encounters:  11/15/23 114/78  10/14/23 133/79  05/21/23 118/68   Wt Readings from Last 3 Encounters:  11/15/23 146 lb 12.8 oz (66.6 kg)  10/14/23 140 lb (63.5 kg)  05/21/23 145 lb 6 oz (65.9 kg)   SpO2 Readings from Last 3 Encounters:  11/15/23 98%  10/14/23 100%  05/21/23 98%      Physical Exam Vitals and nursing note reviewed.  Constitutional:      General: She is not in acute distress.    Appearance: Normal appearance. She is well-developed.  HENT:     Head: Normocephalic and atraumatic.  Eyes:     General: No scleral icterus.       Right eye: No  discharge.        Left eye: No discharge.  Cardiovascular:     Rate and Rhythm: Normal rate and regular rhythm.     Heart sounds: No murmur heard. Pulmonary:     Effort: Pulmonary effort is normal. No respiratory distress.     Breath sounds: Normal breath sounds.  Musculoskeletal:        General: Swelling and tenderness present. Normal range of motion.       Arms:     Cervical back: Normal range of motion and neck supple.     Right lower leg: No edema.     Left lower leg: No edema.     Comments: Index finger on R hand---- still slightly swollen, unable to bend due to swelling  But its is MUCH better than when she was seen in the ER   Skin:    General: Skin is warm and dry.  Neurological:     Mental Status: She is alert and oriented to person, place, and time.  Psychiatric:        Mood and Affect: Mood normal.        Behavior: Behavior normal.        Thought Content: Thought content normal.        Judgment: Judgment normal.      No results found for any visits on 11/15/23.  Last CBC Lab Results  Component Value Date   WBC 11.1 (H) 05/21/2023   HGB 12.6 05/21/2023   HCT 39.9 05/21/2023   MCV 89.0 05/21/2023   RDW 13.0 05/21/2023   PLT 429.0 (H) 05/21/2023   Last metabolic panel Lab Results  Component Value Date   GLUCOSE 85 10/22/2022   NA 137 10/22/2022   K 4.2 10/22/2022   CL 102 10/22/2022   CO2 27 10/22/2022   BUN 18 10/22/2022   CREATININE 0.86 10/22/2022   GFR 78.54 10/22/2022   CALCIUM 9.2 10/22/2022   PROT 7.5 11/30/2021   ALBUMIN 4.3 11/30/2021   BILITOT 0.5 11/30/2021  ALKPHOS 112 11/30/2021   AST 18 11/30/2021   ALT 14 11/30/2021   Last lipids Lab Results  Component Value Date   CHOL 220 (H) 10/15/2022   HDL 66.90 10/15/2022   LDLCALC 138 (H) 10/15/2022   TRIG 75.0 10/15/2022   CHOLHDL 3 10/15/2022   Last hemoglobin A1c No results found for: "HGBA1C" Last thyroid functions Lab Results  Component Value Date   TSH 0.65 05/21/2023    Last vitamin D Lab Results  Component Value Date   VD25OH 57.60 08/19/2019   Last vitamin B12 and Folate No results found for: "VITAMINB12", "FOLATE"    The 10-year ASCVD risk score (Arnett DK, et al., 2019) is: 1.3%    Assessment & Plan:   Problem List Items Addressed This Visit   None Visit Diagnoses       Dog bite, initial encounter    -  Primary   Relevant Medications   clindamycin (CLEOCIN) 300 MG capsule   ciprofloxacin (CIPRO) 500 MG tablet     Assessment and Plan    Dog Bite with Suspected Infection   She presents with a dog bite on the hand from November 13, 2023, with initial ER visit showing no fractures. She reports swelling around the knuckle, inability to bend the finger, and tingling sensation. She has a history of adverse reactions to multiple antibiotics, including sulfa drugs causing hives and other antibiotics causing severe nausea and vomiting. Current symptoms suggest a possible infection or tendon injury, with differential diagnosis including staph infection or tendon damage. We discussed the risks of untreated infection and the benefits of antibiotics. We will prescribe clindamycin 3 times a day for 5 days and ciprofloxacin twice a day for 5 days, advising her to take clindamycin with food to minimize stomach upset. We will consider re-x-ray if swelling persists or worsens and monitor for adverse reactions to antibiotics, instructing her to report any issues.  Thrombophilia   She has concerns about potential complications with certain medications due to a history of blood clots but reports no current symptoms. We discussed the importance of monitoring for signs of blood clots while on new medications and will monitor for signs of blood clots, especially while on new medications.  General Health Maintenance   We discussed her dog's aggressive behavior and the potential need for a dog therapist. The dog is on prednisone for severe skin allergies, which may  contribute to aggression. We discussed alternative treatments for the dog's skin allergies, including Apoquel, and suggested considering discussing Apoquel with the veterinarian as an alternative to prednisone.  Follow-up   We will follow up if symptoms persist or worsen and consider referral to an infectious disease specialist if the infection does not respond to the current antibiotic regimen.        No follow-ups on file.    Donato Schultz, DO

## 2023-11-19 ENCOUNTER — Encounter: Payer: Self-pay | Admitting: Family Medicine

## 2023-11-19 ENCOUNTER — Ambulatory Visit: Payer: BC Managed Care – PPO | Admitting: Family Medicine

## 2023-11-19 VITALS — BP 132/80 | HR 84 | Temp 98.0°F | Resp 16 | Ht 64.0 in | Wt 145.8 lb

## 2023-11-19 DIAGNOSIS — M25641 Stiffness of right hand, not elsewhere classified: Secondary | ICD-10-CM | POA: Diagnosis not present

## 2023-11-19 NOTE — Patient Instructions (Addendum)
Ice/cold pack over area for 10-15 min twice daily.  OK to take Tylenol 1000 mg (2 extra strength tabs) or 975 mg (3 regular strength tabs) every 6 hours as needed.  Finish the antibiotics.  Let me know if you want to see the occupational therapy team. If no better in 3-4 weeks, I need to know.   Let us know if you need anything.  Hand Exercises Hand exercises can be helpful for almost anyone. These exercises can strengthen the hands, improve flexibility and movement, and increase blood flow to the hands. These results can make work and daily tasks easier. Hand exercises can be especially helpful for people who have joint pain from arthritis or have nerve damage from overuse (carpal tunnel syndrome). These exercises can also help people who have injured a hand. Exercises Most of these hand exercises are gentle stretching and motion exercises. It is usually safe to do them often throughout the day. Warming up your hands before exercise may help to reduce stiffness. You can do this with gentle massage or by placing your hands in warm water for 10-15 minutes. It is normal to feel some stretching, pulling, tightness, or mild discomfort as you begin new exercises. This will gradually improve. Stop an exercise right away if you feel sudden, severe pain or your pain gets worse. Ask your health care provider which exercises are best for you. Knuckle bend or "claw" fist Stand or sit with your arm, hand, and all five fingers pointed straight up. Make sure to keep your wrist straight during the exercise. Gently bend your fingers down toward your palm until the tips of your fingers are touching the top of your palm. Keep your big knuckle straight and just bend the small knuckles in your fingers. Hold this position for 3 seconds. Straighten (extend) your fingers back to the starting position. Repeat this exercise 5-10 times with each hand. Full finger fist Stand or sit with your arm, hand, and all five  fingers pointed straight up. Make sure to keep your wrist straight during the exercise. Gently bend your fingers into your palm until the tips of your fingers are touching the middle of your palm. Hold this position for 3 seconds. Extend your fingers back to the starting position, stretching every joint fully. Repeat this exercise 5-10 times with each hand. Straight fist Stand or sit with your arm, hand, and all five fingers pointed straight up. Make sure to keep your wrist straight during the exercise. Gently bend your fingers at the big knuckle, where your fingers meet your hand, and the middle knuckle. Keep the knuckle at the tips of your fingers straight and try to touch the bottom of your palm. Hold this position for 3 seconds. Extend your fingers back to the starting position, stretching every joint fully. Repeat this exercise 5-10 times with each hand. Tabletop Stand or sit with your arm, hand, and all five fingers pointed straight up. Make sure to keep your wrist straight during the exercise. Gently bend your fingers at the big knuckle, where your fingers meet your hand, as far down as you can while keeping the small knuckles in your fingers straight. Think of forming a tabletop with your fingers. Hold this position for 3 seconds. Extend your fingers back to the starting position, stretching every joint fully. Repeat this exercise 5-10 times with each hand. Finger spread Place your hand flat on a table with your palm facing down. Make sure your wrist stays straight as you do  this exercise. Spread your fingers and thumb apart from each other as far as you can until you feel a gentle stretch. Hold this position for 3 seconds. Bring your fingers and thumb tight together again. Hold this position for 3 seconds. Repeat this exercise 5-10 times with each hand. Making circles Stand or sit with your arm, hand, and all five fingers pointed straight up. Make sure to keep your wrist straight  during the exercise. Make a circle by touching the tip of your thumb to the tip of your index finger. Hold for 3 seconds. Then open your hand wide. Repeat this motion with your thumb and each finger on your hand. Repeat this exercise 5-10 times with each hand. Thumb motion Sit with your forearm resting on a table and your wrist straight. Your thumb should be facing up toward the ceiling. Keep your fingers relaxed as you move your thumb. Lift your thumb up as high as you can toward the ceiling. Hold for 3 seconds. Bend your thumb across your palm as far as you can, reaching the tip of your thumb for the small finger (pinkie) side of your palm. Hold for 3 seconds. Repeat this exercise 5-10 times with each hand. Grip strengthening  Hold a stress ball or other soft ball in the middle of your hand. Slowly increase the pressure, squeezing the ball as much as you can without causing pain. Think of bringing the tips of your fingers into the middle of your palm. All of your finger joints should bend when doing this exercise. Hold your squeeze for 3 seconds, then relax. Repeat this exercise 5-10 times with each hand. Contact a health care provider if: Your hand pain or discomfort gets much worse when you do an exercise. Your hand pain or discomfort does not improve within 2 hours after you exercise. If you have any of these problems, stop doing these exercises right away. Do not do them again unless your health care provider says that you can. Get help right away if: You develop sudden, severe hand pain or swelling. If this happens, stop doing these exercises right away. Do not do them again unless your health care provider says that you can. Make sure you discuss any questions you have with your health care provider. Document Revised: 03/05/2019 Document Reviewed: 11/13/2018 Elsevier Patient Education  2020 [employer].

## 2023-11-19 NOTE — Progress Notes (Signed)
Chief Complaint  Patient presents with   Follow-up    Follow up    Subjective: Patient is a 52 y.o. female here for f/u for her R index finger.  On 10/14/2023, patient was bitten by her dog.  She went to the ER and was placed on antibiotic.  She was unfortunately unable to tolerate it so she did not finish the course.  She was staying on 12/20 and was placed on clindamycin and ciprofloxacin.  She has been compliant with that medication regimen and reports no significant adverse effects.  There is no drainage, redness, or fevers.  She is having some decreased range of motion.  The swelling has significantly improved.  Past Medical History:  Diagnosis Date   Allergy    Asthma    Diverticulitis    History of blood transfusion    Had after delivery of baby   Hx of blood clots 2002   unprovoked   Hypertension    Thyroid disease     Objective: BP 132/80   Pulse 84   Temp 98 F (36.7 C) (Oral)   Resp 16   Ht 5\' 4"  (1.626 m)   Wt 145 lb 12.8 oz (66.1 kg)   SpO2 99%   BMI 25.03 kg/m  General: Awake, appears stated age Heart: Brisk capillary refill MSK: edema noted over prox and int phalanx of 2nd R digit. Decreased flexion of the PIP and DIP jts, DIP jt more severe. No ttp.  Lungs: No accessory muscle use Psych: Age appropriate judgment and insight, normal affect and mood  Assessment and Plan: Decreased range of motion of finger of right hand  Stretches/exercises. Tylenol for pain. Offered OT which she declined for now. If no improvement in next 3-4 weeks will refer. X-ray from 11/18 reviewed, no indication for repeat at this point.  The patient voiced understanding and agreement to the plan.  Jilda Roche [place], DO 11/19/23  12:14 PM

## 2023-11-21 ENCOUNTER — Other Ambulatory Visit: Payer: Self-pay | Admitting: Family Medicine

## 2023-11-21 DIAGNOSIS — I10 Essential (primary) hypertension: Secondary | ICD-10-CM

## 2024-01-10 ENCOUNTER — Encounter: Payer: Self-pay | Admitting: Family Medicine

## 2024-01-10 ENCOUNTER — Other Ambulatory Visit: Payer: Self-pay | Admitting: Family Medicine

## 2024-01-10 MED ORDER — AZITHROMYCIN 250 MG PO TABS: ORAL_TABLET | ORAL | 0 refills | Status: DC

## 2024-01-13 ENCOUNTER — Other Ambulatory Visit: Payer: Self-pay

## 2024-01-13 DIAGNOSIS — I2699 Other pulmonary embolism without acute cor pulmonale: Secondary | ICD-10-CM

## 2024-01-13 DIAGNOSIS — M25641 Stiffness of right hand, not elsewhere classified: Secondary | ICD-10-CM

## 2024-01-15 NOTE — Addendum Note (Signed)
Addended byConrad Paukaa D on: 01/15/2024 09:32 AM   Modules accepted: Orders

## 2024-01-27 ENCOUNTER — Ambulatory Visit: Payer: 59 | Attending: Family Medicine | Admitting: Occupational Therapy

## 2024-01-27 DIAGNOSIS — M6281 Muscle weakness (generalized): Secondary | ICD-10-CM | POA: Insufficient documentation

## 2024-01-27 DIAGNOSIS — M25641 Stiffness of right hand, not elsewhere classified: Secondary | ICD-10-CM | POA: Insufficient documentation

## 2024-01-27 DIAGNOSIS — R600 Localized edema: Secondary | ICD-10-CM | POA: Diagnosis present

## 2024-01-27 DIAGNOSIS — M79644 Pain in right finger(s): Secondary | ICD-10-CM | POA: Insufficient documentation

## 2024-01-27 NOTE — Patient Instructions (Signed)
 Edema Reduction (Contrast Baths)    Have 2 containers deep enough for involved area to be immersed. Fill one with warm water and the other with slightly chilled water. Soak in warm water for 1 to 2 minutes; cold for  to 1 minute. Alternate and continue for 5-10 minutes. End in warm water.  Copyright  VHI. All rights reserved.    Perform retrograde from finger tip back to hand, massage 5 mins 1-2 x day with lotion    MP Flexion (Active Isolated)   Bend __index____ finger at large knuckle, keeping other fingers straight. Do not bend tips. Repeat _10-15___ times. Do __3_ sessions per day.  AROM: PIP Flexion / Extension   Pinch bottom knuckle of __index______ finger of hand to prevent bending. Actively bend middle knuckle until stretch is felt. Hold __5__ seconds. Relax. Straighten finger as far as possible. Repeat __10-15__ times per set. Do _3__ sessions per day.   AROM: DIP Flexion / Extension   Pinch middle knuckle of ____index____ finger of  hand to prevent bending. Bend end knuckle until stretch is felt. Hold _5___ seconds. Relax. Straighten finger as far as possible. Repeat _10-15___ times per set.  Do _4-6___ sessions per day.  AROM: Finger Flexion / Extension   Actively bend fingers of  hand. Start with knuckles furthest from palm, and slowly make a fist. Hold __5__ seconds. Relax. Then straighten fingers as far as possible. Repeat _10-15___ times per set.  Do _3__ sessions per day.  Copyright  VHI. All rights reserved.      Flexor Tendon Gliding (Active Hook Fist)   With fingers and knuckles straight, bend middle and tip joints. Do not bend large knuckles. Repeat _10-15___ times. Do _3__ sessions per day.  MP Flexion (Active)   With back of hand on table, bend large knuckles as far as they will go, keeping small joints straight. Repeat _10-15___ times. Do __3_ sessions per day. Activity: Reach into a narrow container.*      Finger Flexion /  Extension   With palm up, bend fingers of left hand toward palm, making a  fist. Straighten fingers, opening fist. Repeat sequence _10-15___ times per session. Do _3_ sessions per day. Hand Variation: Palm down   Copyright  VHI. All rights reserved.

## 2024-01-27 NOTE — Therapy (Signed)
 OUTPATIENT OCCUPATIONAL THERAPY ORTHO EVALUATION  Patient Name: Valerie Walker MRN: [id number] DOB:Aug 11, 1971, 53 y.o., female Today's Date: 01/27/2024  PCP: Dr. Carmelia Roller REFERRING PROVIDER: Dr. Carmelia Roller  END OF SESSION:  OT End of Session - 01/27/24 1334     Visit Number 1    Number of Visits 9    Date for OT Re-Evaluation 03/30/24    Authorization Type [employer]    OT Start Time [phone]    OT Stop Time 0928    OT Time Calculation (min) 42 min             Past Medical History:  Diagnosis Date   Allergy    Asthma    Diverticulitis    History of blood transfusion    Had after delivery of baby   Hx of blood clots 2002   unprovoked   Hypertension    Thyroid disease    No past surgical history on file. Patient Active Problem List   Diagnosis Date Noted   Tear of right acetabular labrum 02/16/2022   Essential hypertension 09/01/2018   Liver lesion 09/01/2018   History of pulmonary embolus (PE) 09/01/2018    ONSET DATE: 10/14/23  REFERRING DIAG:  Diagnosis  M25.641 (ICD-10-CM) - Decreased range of motion of finger of right hand    THERAPY DIAG:  Stiffness of right hand, not elsewhere classified - Plan: Ot plan of care cert/re-cert  Localized edema - Plan: Ot plan of care cert/re-cert  Pain in right finger(s) - Plan: Ot plan of care cert/re-cert  Muscle weakness (generalized) - Plan: Ot plan of care cert/re-cert  Rationale for Evaluation and Treatment:   SUBJECTIVE:   SUBJECTIVE STATEMENT: Pt reports her dog bit her when she tried to remove a dime from his teeth Pt accompanied by: self  PERTINENT HISTORY:  On 10/14/2023, patient was bitten by her dog. She was place on antibiotics  PRECAUTIONS: None     WEIGHT BEARING RESTRICTIONS: No  PAIN:  Are you having pain? Yes: NPRS scale: 3-5/10 Pain location: right index finger Pain description: aching, tight Aggravating factors: movement, use Relieving factors: rest/ not moving  FALLS: Has patient  fallen in last 6 months? No  LIVING ENVIRONMENT: Lives with: lives with their family and lives with their spouse Lives in: House/apartment   PLOF: Independent  PATIENT GOALS: improve A/ROM,   NEXT MD VISIT: not scheduled  OBJECTIVE:  Note: Objective measures were completed at Evaluation unless otherwise noted.  HAND DOMINANCE: Right  ADLs: WFL  FUNCTIONAL OUTCOME MEASURES: Quick Dash: 27.5% disability  UPPER EXTREMITY ROM:       Active ROM Right eval Left eval  Thumb MCP (0-60)    Thumb IP (0-80)    Thumb Radial abd/add (0-55)     Thumb Palmar abd/add (0-45)     Thumb Opposition to Small Finger     Index MCP (0-90) 85    Index PIP (0-100) 60/ -30 ext    Index DIP (0-70)  25    Long MCP (0-90)      Long PIP (0-100)      Long DIP (0-70)      Ring MCP (0-90)      Ring PIP (0-100)      Ring DIP (0-70)      Little MCP (0-90)      Little PIP (0-100)      Little DIP (0-70)      (Blank rows = not tested)   HAND FUNCTION: Grip strength: Right: 23 lbs;  Left: 50 lbs, 3 point pinch: Right: 12 lbs, Left: 12 lbs, and Tip pinch: Right 2 lbs, Left: 8 lbs  COORDINATION:NT   SENSATION: hypersensitive , cold aggravates pain  EDEMA: moderate in right index finger particularly PIP joint  COGNITION: Overall cognitive status: Within functional limits for tasks assessed   OBSERVATIONS: Pt is motivated to improve   TREATMENT DATE: 01/27/24    eval, see Pt. education for treatment                                                                                                                           PATIENT EDUCATION: Education details: role of OT, potential goals, retrograde massage, contrast bath, inital HEP Person educated: Patient Education method: Explanation, Demonstration, Verbal cues, and Handouts Education comprehension: verbalized understanding, returned demonstration, and verbal cues required  HOME EXERCISE PROGRAM: inital A/ROM  GOALS: Goals  reviewed with patient? Yes  SHORT TERM GOALS: Target date: 02/27/24  I with HEP  Goal status: INITIAL  2.  I with edema control techniques  Goal status: INITIAL  3.  Pt will increase index PIP flexion to 80* for increased functional use Baseline: 60* Goal status: INITIAL  4.  Pt will increase index PIP extension to -15 for increased functional use Baseline: -30 Goal status: INITIAL  5.  Pt will increase DIP flexion to 40* for increased functional use. Baseline: 25 Goal status: INITIAL  6.  I with adapted strategies/ AE for ADLs/IADLs.  Goal status: INITIAL  LONG TERM GOALS: Target date: 03/28/24  I with updated HEP.  Goal status: INITIAL  2.  Pt will improve Quick Dash score to 20% disability or better. Baseline: 27.5% Goal status: INITIAL  3.  Pt will increase grip strength to 30 lbs or greater for increased RUE functional use. Baseline: RUE 23, LUE 50 lbs Goal status: INITIAL  4.  Pt will increase RUE index finger tip pinch to 6 lbs for increased RUE functional use Baseline: 2 lbs, tip pinch R Goal status: INITIAL  5.  Pt will report RUE pain no greater than 3/10 for ADLs/ IADLs.  Goal status: INITIAL  ASSESSMENT:  CLINICAL IMPRESSION: Patient is a 53 y.o. female who was seen today for occupational therapy evaluation for stiffness of right index finger /sp dog bite in [facility] 2024.Pt presents with the perfromance deficits below. she can benefit from skilled occupational therapy to address these deficits in order to maximize pt's safety and I with ADLs/IADLs   PERFORMANCE DEFICITS: in functional skills including ADLs, IADLs, coordination, dexterity, sensation, edema, ROM, strength, pain, flexibility, Fine motor control, decreased knowledge of use of DME, and UE functional use,and psychosocial skills including coping strategies, environmental adaptation, habits, interpersonal interactions, and routines and behaviors.   IMPAIRMENTS: are limiting patient from  ADLs, IADLs, rest and sleep, work, play, leisure, and social participation.   COMORBIDITIES: may have co-morbidities  that affects occupational performance. Patient will benefit from skilled OT to address above impairments and improve  overall function.  MODIFICATION OR ASSISTANCE TO COMPLETE EVALUATION: No modification of tasks or assist necessary to complete an evaluation.  OT OCCUPATIONAL PROFILE AND HISTORY: Detailed assessment: Review of records and additional review of physical, cognitive, psychosocial history related to current functional performance.  CLINICAL DECISION MAKING: LOW - limited treatment options, no task modification necessary  REHAB POTENTIAL: Good  EVALUATION COMPLEXITY: Low      PLAN:  OT FREQUENCY: 1x/week  OT DURATION: 8 weeksplus eval  PLANNED INTERVENTIONS: 97168 OT Re-evaluation, 97535 self care/ADL training, 16109 therapeutic exercise, 97530 therapeutic activity, 97112 neuromuscular re-education, 97140 manual therapy, 97035 ultrasound, 97018 paraffin, 60454 moist heat, 97010 cryotherapy, 97034 contrast bath, 97014 electrical stimulation unattended, 97760 Splinting (initial encounter), [id number] Subsequent splinting/medication, scar mobilization, passive range of motion, coping strategies training, patient/family education, and DME and/or AE instructions  RECOMMENDED OTHER SERVICES none CONSULTED AND AGREED WITH PLAN OF CARE: Patient  PLAN FOR NEXT SESSION: Review and progress HEP, consider [place], consider yellow putty   Samanthajo Payano, OT 01/27/2024, 1:56 PM

## 2024-02-13 NOTE — Therapy (Incomplete)
 OUTPATIENT OCCUPATIONAL THERAPY ORTHO TREATMENT  Patient Name: Valerie Walker MRN: [id number] DOB:06/27/1971, 53 y.o., female Today's Date: 02/13/2024  PCP: Dr. Carmelia Roller REFERRING PROVIDER: Dr. Carmelia Roller  END OF SESSION:    Past Medical History:  Diagnosis Date   Allergy    Asthma    Diverticulitis    History of blood transfusion    Had after delivery of baby   Hx of blood clots 2002   unprovoked   Hypertension    Thyroid disease    No past surgical history on file. Patient Active Problem List   Diagnosis Date Noted   Tear of right acetabular labrum 02/16/2022   Essential hypertension 09/01/2018   Liver lesion 09/01/2018   History of pulmonary embolus (PE) 09/01/2018    ONSET DATE: 10/14/23  REFERRING DIAG:  Diagnosis  M25.641 (ICD-10-CM) - Decreased range of motion of finger of right hand    THERAPY DIAG:  No diagnosis found.  Rationale for Evaluation and Treatment:   SUBJECTIVE:   SUBJECTIVE STATEMENT: *** Pt reports her dog bit her when she tried to remove a dime from his teeth  Pt accompanied by: self  PERTINENT HISTORY:  On 10/14/2023, patient was bitten by her dog. She was place on antibiotics  PRECAUTIONS: None     WEIGHT BEARING RESTRICTIONS: No  PAIN:  Are you having pain? Yes: NPRS scale: 3-5/10 Pain location: right index finger Pain description: aching, tight Aggravating factors: movement, use Relieving factors: rest/ not moving  FALLS: Has patient fallen in last 6 months? No  LIVING ENVIRONMENT: Lives with: lives with their family and lives with their spouse Lives in: House/apartment   PLOF: Independent  PATIENT GOALS: improve A/ROM,   NEXT MD VISIT: not scheduled  OBJECTIVE:  Note: Objective measures were completed at Evaluation unless otherwise noted.  HAND DOMINANCE: Right  ADLs: WFL  FUNCTIONAL OUTCOME MEASURES: Quick Dash: 27.5% disability  UPPER EXTREMITY ROM:       Active ROM Right eval Left eval   Thumb MCP (0-60)    Thumb IP (0-80)    Thumb Radial abd/add (0-55)     Thumb Palmar abd/add (0-45)     Thumb Opposition to Small Finger     Index MCP (0-90) 85    Index PIP (0-100) 60/ -30 ext    Index DIP (0-70)  25    Long MCP (0-90)      Long PIP (0-100)      Long DIP (0-70)      Ring MCP (0-90)      Ring PIP (0-100)      Ring DIP (0-70)      Little MCP (0-90)      Little PIP (0-100)      Little DIP (0-70)      (Blank rows = not tested)   HAND FUNCTION: Grip strength: Right: 23 lbs; Left: 50 lbs, 3 point pinch: Right: 12 lbs, Left: 12 lbs, and Tip pinch: Right 2 lbs, Left: 8 lbs  COORDINATION:NT   SENSATION: hypersensitive , cold aggravates pain  EDEMA: moderate in right index finger particularly PIP joint  COGNITION: Overall cognitive status: Within functional limits for tasks assessed   OBSERVATIONS: Pt is motivated to improve   TREATMENT DATE:   ***:  ***  01/27/24    eval, see Pt. education for treatment  PATIENT EDUCATION: Education details: *** role of OT, potential goals, retrograde massage, contrast bath, inital HEP Person educated: Patient Education method: Explanation, Demonstration, Verbal cues, and Handouts Education comprehension: verbalized understanding, returned demonstration, and verbal cues required  HOME EXERCISE PROGRAM: inital A/ROM  GOALS: Goals reviewed with patient? Yes  SHORT TERM GOALS: Target date: 02/27/24  I with HEP Goal status: INITIAL  2.  I with edema control techniques Goal status: INITIAL  3.  Pt will increase index PIP flexion to 80* for increased functional use Baseline: 60* Goal status: INITIAL  4.  Pt will increase index PIP extension to -15 for increased functional use Baseline: -30 Goal status: INITIAL  5.  Pt will increase DIP flexion to 40* for increased functional  use. Baseline: 25 Goal status: INITIAL  6.  I with adapted strategies/ AE for ADLs/IADLs. Goal status: INITIAL  LONG TERM GOALS: Target date: 03/28/24  I with updated HEP. Goal status: INITIAL  2.  Pt will improve Quick Dash score to 20% disability or better. Baseline: 27.5% Goal status: INITIAL  3.  Pt will increase grip strength to 30 lbs or greater for increased RUE functional use. Baseline: RUE 23, LUE 50 lbs Goal status: INITIAL  4.  Pt will increase RUE index finger tip pinch to 6 lbs for increased RUE functional use Baseline: 2 lbs, tip pinch R Goal status: INITIAL  5.  Pt will report RUE pain no greater than 3/10 for ADLs/ IADLs.  Goal status: INITIAL  ASSESSMENT:  CLINICAL IMPRESSION: *** Patient is a 53 y.o. female who was seen today for occupational therapy evaluation for stiffness of right index finger /sp dog bite in [facility] 2024.Pt presents with the perfromance deficits below. she can benefit from skilled occupational therapy to address these deficits in order to maximize pt's safety and I with ADLs/IADLs   PERFORMANCE DEFICITS: in functional skills including ADLs, IADLs, coordination, dexterity, sensation, edema, ROM, strength, pain, flexibility, Fine motor control, decreased knowledge of use of DME, and UE functional use,and psychosocial skills including coping strategies, environmental adaptation, habits, interpersonal interactions, and routines and behaviors.   IMPAIRMENTS: are limiting patient from ADLs, IADLs, rest and sleep, work, play, leisure, and social participation.   COMORBIDITIES: may have co-morbidities  that affects occupational performance. Patient will benefit from skilled OT to address above impairments and improve overall function.  MODIFICATION OR ASSISTANCE TO COMPLETE EVALUATION: No modification of tasks or assist necessary to complete an evaluation.  OT OCCUPATIONAL PROFILE AND HISTORY: Detailed assessment: Review of records and  additional review of physical, cognitive, psychosocial history related to current functional performance.  CLINICAL DECISION MAKING: LOW - limited treatment options, no task modification necessary  REHAB POTENTIAL: Good  EVALUATION COMPLEXITY: Low      PLAN:  OT FREQUENCY: 1x/week  OT DURATION: 8 weeksplus eval  PLANNED INTERVENTIONS: 97168 OT Re-evaluation, 97535 self care/ADL training, 02725 therapeutic exercise, 97530 therapeutic activity, 97112 neuromuscular re-education, 97140 manual therapy, 97035 ultrasound, 97018 paraffin, 36644 moist heat, 97010 cryotherapy, 97034 contrast bath, 97014 electrical stimulation unattended, 97760 Splinting (initial encounter), [id number] Subsequent splinting/medication, scar mobilization, passive range of motion, coping strategies training, patient/family education, and DME and/or AE instructions  RECOMMENDED OTHER SERVICES none CONSULTED AND AGREED WITH PLAN OF CARE: Patient  PLAN FOR NEXT SESSION: *** Review and progress HEP, consider [place], consider yellow putty   Samyra Limb, OTR/L  02/13/2024, 12:05 PM

## 2024-02-14 ENCOUNTER — Ambulatory Visit: Admitting: Occupational Therapy

## 2024-02-20 ENCOUNTER — Ambulatory Visit: Admitting: Occupational Therapy

## 2024-02-20 DIAGNOSIS — M6281 Muscle weakness (generalized): Secondary | ICD-10-CM

## 2024-02-20 DIAGNOSIS — M25641 Stiffness of right hand, not elsewhere classified: Secondary | ICD-10-CM

## 2024-02-20 DIAGNOSIS — M79644 Pain in right finger(s): Secondary | ICD-10-CM

## 2024-02-20 DIAGNOSIS — R600 Localized edema: Secondary | ICD-10-CM

## 2024-02-20 NOTE — Therapy (Signed)
 OUTPATIENT OCCUPATIONAL THERAPY ORTHO EVALUATION  Patient Name: Valerie Walker MRN: [id number] DOB:1971/05/31, 53 y.o., female Today's Date: 02/21/2024  PCP: Dr. Carmelia Roller REFERRING PROVIDER: Dr. Carmelia Roller  END OF SESSION:  OT End of Session - 02/21/24 1001     Visit Number 2    Number of Visits 9    Date for OT Re-Evaluation 03/30/24    Authorization Type [employer]    OT Start Time 1103    OT Stop Time 1135    OT Time Calculation (min) 32 min              Past Medical History:  Diagnosis Date   Allergy    Asthma    Diverticulitis    History of blood transfusion    Had after delivery of baby   Hx of blood clots 2002   unprovoked   Hypertension    Thyroid disease    History reviewed. No pertinent surgical history. Patient Active Problem List   Diagnosis Date Noted   Tear of right acetabular labrum 02/16/2022   Essential hypertension 09/01/2018   Liver lesion 09/01/2018   History of pulmonary embolus (PE) 09/01/2018    ONSET DATE: 10/14/23  REFERRING DIAG:  Diagnosis  M25.641 (ICD-10-CM) - Decreased range of motion of finger of right hand    THERAPY DIAG:  Localized edema  Pain in right finger(s)  Stiffness of right hand, not elsewhere classified  Muscle weakness (generalized)  Rationale for Evaluation and Treatment:   SUBJECTIVE:   SUBJECTIVE STATEMENT: Pt reports she has been exercising but has had some pain and swelling, pt to contact her MD Pt accompanied by: self  PERTINENT HISTORY:  On 10/14/2023, patient was bitten by her dog. She was place on antibiotics  PRECAUTIONS: None     WEIGHT BEARING RESTRICTIONS: No  PAIN:  Are you having pain? Yes: NPRS scale: 3-5/10 Pain location: right index finger Pain description: aching, tight Aggravating factors: movement, use Relieving factors: rest/ not moving  FALLS: Has patient fallen in last 6 months? No  LIVING ENVIRONMENT: Lives with: lives with their family and lives with their  spouse Lives in: House/apartment   PLOF: Independent  PATIENT GOALS: improve A/ROM,   NEXT MD VISIT: not scheduled  OBJECTIVE:  Note: Objective measures were completed at Evaluation unless otherwise noted.  HAND DOMINANCE: Right  ADLs: WFL  FUNCTIONAL OUTCOME MEASURES: Quick Dash: 27.5% disability  UPPER EXTREMITY ROM:       Active ROM Right eval 02/20/24   Thumb MCP (0-60)    Thumb IP (0-80)    Thumb Radial abd/add (0-55)     Thumb Palmar abd/add (0-45)     Thumb Opposition to Small Finger     Index MCP (0-90) 85    Index PIP (0-100) 60/ -30 ext 80/-15   Index DIP (0-70)  25 40   Long MCP (0-90)      Long PIP (0-100)      Long DIP (0-70)      Ring MCP (0-90)      Ring PIP (0-100)      Ring DIP (0-70)      Little MCP (0-90)      Little PIP (0-100)      Little DIP (0-70)      (Blank rows = not tested)   HAND FUNCTION: Grip strength: Right: 23 lbs; Left: 50 lbs, 3 point pinch: Right: 12 lbs, Left: 12 lbs, and Tip pinch: Right 2 lbs, Left: 8 lbs  COORDINATION:NT  SENSATION: hypersensitive , cold aggravates pain  EDEMA: moderate in right index finger particularly PIP joint  COGNITION: Overall cognitive status: Within functional limits for tasks assessed   OBSERVATIONS: Pt is motivated to improve   TREATMENT DATE: 02/20/24- [place] , 0.8 w/cm 2, 20% x 8 mins no adverse reactions. PIP and DIP blocking exercises, composite finger flexion/ extension, gentle passive stretch in composite flexion. Ice massage with ice probe x 5 mins to volar and dorsal index finger, no adverse reactions, decreased pain after use.   01/27/24    eval, see Pt. education for treatment                                                                                                                           PATIENT EDUCATION: Education details: role of OT, potential goals, retrograde massage, contrast bath, inital HEP Person educated: Patient Education method: Explanation,  Demonstration, Verbal cues, and Handouts Education comprehension: verbalized understanding, returned demonstration, and verbal cues required  HOME EXERCISE PROGRAM: inital A/ROM  GOALS: Goals reviewed with patient? Yes  SHORT TERM GOALS: Target date: 02/27/24  I with HEP  Goal status: INITIAL  2.  I with edema control techniques  Goal status: INITIAL  3.  Pt will increase index PIP flexion to 80* for increased functional use Baseline: 60* Goal status: INITIAL  4.  Pt will increase index PIP extension to -15 for increased functional use Baseline: -30 Goal status: INITIAL  5.  Pt will increase DIP flexion to 40* for increased functional use. Baseline: 25 Goal status: INITIAL  6.  I with adapted strategies/ AE for ADLs/IADLs.  Goal status: INITIAL  LONG TERM GOALS: Target date: 03/28/24  I with updated HEP.  Goal status: INITIAL  2.  Pt will improve Quick Dash score to 20% disability or better. Baseline: 27.5% Goal status: INITIAL  3.  Pt will increase grip strength to 30 lbs or greater for increased RUE functional use. Baseline: RUE 23, LUE 50 lbs Goal status: INITIAL  4.  Pt will increase RUE index finger tip pinch to 6 lbs for increased RUE functional use Baseline: 2 lbs, tip pinch R Goal status: INITIAL  5.  Pt will report RUE pain no greater than 3/10 for ADLs/ IADLs.  Goal status: INITIAL  ASSESSMENT:  CLINICAL IMPRESSION: Patient is a 53 y.o. female who was seen today for occupational therapy evaluation for stiffness of right index finger /sp dog bite in [facility] 2024.Pt presents with the perfromance deficits below. she can benefit from skilled occupational therapy to address these deficits in order to maximize pt's safety and I with ADLs/IADLs   PERFORMANCE DEFICITS: in functional skills including ADLs, IADLs, coordination, dexterity, sensation, edema, ROM, strength, pain, flexibility, Fine motor control, decreased knowledge of use of DME, and UE  functional use,and psychosocial skills including coping strategies, environmental adaptation, habits, interpersonal interactions, and routines and behaviors.   IMPAIRMENTS: are limiting patient from ADLs, IADLs, rest and sleep, work, play, leisure,  and social participation.   COMORBIDITIES: may have co-morbidities  that affects occupational performance. Patient will benefit from skilled OT to address above impairments and improve overall function.  MODIFICATION OR ASSISTANCE TO COMPLETE EVALUATION: No modification of tasks or assist necessary to complete an evaluation.  OT OCCUPATIONAL PROFILE AND HISTORY: Detailed assessment: Review of records and additional review of physical, cognitive, psychosocial history related to current functional performance.  CLINICAL DECISION MAKING: LOW - limited treatment options, no task modification necessary  REHAB POTENTIAL: Good  EVALUATION COMPLEXITY: Low      PLAN:  OT FREQUENCY: 1x/week  OT DURATION: 8 weeksplus eval  PLANNED INTERVENTIONS: 97168 OT Re-evaluation, 97535 self care/ADL training, 16109 therapeutic exercise, 97530 therapeutic activity, 97112 neuromuscular re-education, 97140 manual therapy, 97035 ultrasound, 97018 paraffin, 60454 moist heat, 97010 cryotherapy, 97034 contrast bath, 97014 electrical stimulation unattended, 97760 Splinting (initial encounter), [id number] Subsequent splinting/medication, scar mobilization, passive range of motion, coping strategies training, patient/family education, and DME and/or AE instructions  RECOMMENDED OTHER SERVICES none CONSULTED AND AGREED WITH PLAN OF CARE: Patient  PLAN FOR NEXT SESSION: Review and progress HEP, consider [place], consider yellow putty   Lillian Tigges, OT 02/21/2024, 10:02 AM

## 2024-02-21 ENCOUNTER — Encounter: Payer: Self-pay | Admitting: Occupational Therapy

## 2024-02-24 ENCOUNTER — Encounter: Payer: Self-pay | Admitting: Family Medicine

## 2024-02-24 ENCOUNTER — Other Ambulatory Visit: Payer: Self-pay | Admitting: Family Medicine

## 2024-02-24 ENCOUNTER — Ambulatory Visit: Admitting: Occupational Therapy

## 2024-02-24 DIAGNOSIS — M25641 Stiffness of right hand, not elsewhere classified: Secondary | ICD-10-CM

## 2024-02-24 DIAGNOSIS — M79644 Pain in right finger(s): Secondary | ICD-10-CM

## 2024-02-24 NOTE — Therapy (Deleted)
 OUTPATIENT OCCUPATIONAL THERAPY ORTHO EVALUATION  Patient Name: Valerie Walker MRN: [id number] DOB:12/10/1970, 53 y.o., female Today's Date: 02/24/2024  PCP: Dr. Carmelia Roller REFERRING PROVIDER: Dr. Carmelia Roller  END OF SESSION:     Past Medical History:  Diagnosis Date   Allergy    Asthma    Diverticulitis    History of blood transfusion    Had after delivery of baby   Hx of blood clots 2002   unprovoked   Hypertension    Thyroid disease    No past surgical history on file. Patient Active Problem List   Diagnosis Date Noted   Tear of right acetabular labrum 02/16/2022   Essential hypertension 09/01/2018   Liver lesion 09/01/2018   History of pulmonary embolus (PE) 09/01/2018    ONSET DATE: 10/14/23  REFERRING DIAG:  Diagnosis  M25.641 (ICD-10-CM) - Decreased range of motion of finger of right hand    THERAPY DIAG:  No diagnosis found.  Rationale for Evaluation and Treatment:   SUBJECTIVE:   SUBJECTIVE STATEMENT: Pt reports she has been exercising but has had some pain and swelling, pt to contact her MD Pt accompanied by: self  PERTINENT HISTORY:  On 10/14/2023, patient was bitten by her dog. She was place on antibiotics  PRECAUTIONS: None     WEIGHT BEARING RESTRICTIONS: No  PAIN:  Are you having pain? Yes: NPRS scale: 3-5/10 Pain location: right index finger Pain description: aching, tight Aggravating factors: movement, use Relieving factors: rest/ not moving  FALLS: Has patient fallen in last 6 months? No  LIVING ENVIRONMENT: Lives with: lives with their family and lives with their spouse Lives in: House/apartment   PLOF: Independent  PATIENT GOALS: improve A/ROM,   NEXT MD VISIT: not scheduled  OBJECTIVE:  Note: Objective measures were completed at Evaluation unless otherwise noted.  HAND DOMINANCE: Right  ADLs: WFL  FUNCTIONAL OUTCOME MEASURES: Quick Dash: 27.5% disability  UPPER EXTREMITY ROM:       Active ROM  Right eval 02/20/24   Thumb MCP (0-60)    Thumb IP (0-80)    Thumb Radial abd/add (0-55)     Thumb Palmar abd/add (0-45)     Thumb Opposition to Small Finger     Index MCP (0-90) 85    Index PIP (0-100) 60/ -30 ext 80/-15   Index DIP (0-70)  25 40   Long MCP (0-90)      Long PIP (0-100)      Long DIP (0-70)      Ring MCP (0-90)      Ring PIP (0-100)      Ring DIP (0-70)      Little MCP (0-90)      Little PIP (0-100)      Little DIP (0-70)      (Blank rows = not tested)   HAND FUNCTION: Grip strength: Right: 23 lbs; Left: 50 lbs, 3 point pinch: Right: 12 lbs, Left: 12 lbs, and Tip pinch: Right 2 lbs, Left: 8 lbs  COORDINATION:NT   SENSATION: hypersensitive , cold aggravates pain  EDEMA: moderate in right index finger particularly PIP joint  COGNITION: Overall cognitive status: Within functional limits for tasks assessed   OBSERVATIONS: Pt is motivated to improve   TREATMENT DATE: 02/20/24- [place] , 0.8 w/cm 2, 20% x 8 mins no adverse reactions. PIP and DIP blocking exercises, composite finger flexion/ extension, gentle passive stretch in composite flexion. Ice massage with ice probe x 5 mins to volar and dorsal index finger, no adverse reactions,  decreased pain after use.   01/27/24    eval, see Pt. education for treatment                                                                                                                           PATIENT EDUCATION: Education details: role of OT, potential goals, retrograde massage, contrast bath, inital HEP Person educated: Patient Education method: Explanation, Demonstration, Verbal cues, and Handouts Education comprehension: verbalized understanding, returned demonstration, and verbal cues required  HOME EXERCISE PROGRAM: inital A/ROM  GOALS: Goals reviewed with patient? Yes  SHORT TERM GOALS: Target date: 02/27/24  I with HEP  Goal status: INITIAL  2.  I with edema control techniques  Goal status:  INITIAL  3.  Pt will increase index PIP flexion to 80* for increased functional use Baseline: 60* Goal status: INITIAL  4.  Pt will increase index PIP extension to -15 for increased functional use Baseline: -30 Goal status: INITIAL  5.  Pt will increase DIP flexion to 40* for increased functional use. Baseline: 25 Goal status: INITIAL  6.  I with adapted strategies/ AE for ADLs/IADLs.  Goal status: INITIAL  LONG TERM GOALS: Target date: 03/28/24  I with updated HEP.  Goal status: INITIAL  2.  Pt will improve Quick Dash score to 20% disability or better. Baseline: 27.5% Goal status: INITIAL  3.  Pt will increase grip strength to 30 lbs or greater for increased RUE functional use. Baseline: RUE 23, LUE 50 lbs Goal status: INITIAL  4.  Pt will increase RUE index finger tip pinch to 6 lbs for increased RUE functional use Baseline: 2 lbs, tip pinch R Goal status: INITIAL  5.  Pt will report RUE pain no greater than 3/10 for ADLs/ IADLs.  Goal status: INITIAL  ASSESSMENT:  CLINICAL IMPRESSION: Patient is a 53 y.o. female who was seen today for occupational therapy evaluation for stiffness of right index finger /sp dog bite in [facility] 2024.Pt presents with the perfromance deficits below. she can benefit from skilled occupational therapy to address these deficits in order to maximize pt's safety and I with ADLs/IADLs   PERFORMANCE DEFICITS: in functional skills including ADLs, IADLs, coordination, dexterity, sensation, edema, ROM, strength, pain, flexibility, Fine motor control, decreased knowledge of use of DME, and UE functional use,and psychosocial skills including coping strategies, environmental adaptation, habits, interpersonal interactions, and routines and behaviors.   IMPAIRMENTS: are limiting patient from ADLs, IADLs, rest and sleep, work, play, leisure, and social participation.   COMORBIDITIES: may have co-morbidities  that affects occupational performance.  Patient will benefit from skilled OT to address above impairments and improve overall function.  MODIFICATION OR ASSISTANCE TO COMPLETE EVALUATION: No modification of tasks or assist necessary to complete an evaluation.  OT OCCUPATIONAL PROFILE AND HISTORY: Detailed assessment: Review of records and additional review of physical, cognitive, psychosocial history related to current functional performance.  CLINICAL DECISION MAKING: LOW - limited treatment options, no task modification necessary  REHAB POTENTIAL: Good  EVALUATION COMPLEXITY: Low      PLAN:  OT FREQUENCY: 1x/week  OT DURATION: 8 weeksplus eval  PLANNED INTERVENTIONS: 97168 OT Re-evaluation, 97535 self care/ADL training, 34742 therapeutic exercise, 97530 therapeutic activity, 97112 neuromuscular re-education, 97140 manual therapy, 97035 ultrasound, 97018 paraffin, 59563 moist heat, 97010 cryotherapy, 97034 contrast bath, 97014 electrical stimulation unattended, 97760 Splinting (initial encounter), [id number] Subsequent splinting/medication, scar mobilization, passive range of motion, coping strategies training, patient/family education, and DME and/or AE instructions  RECOMMENDED OTHER SERVICES none CONSULTED AND AGREED WITH PLAN OF CARE: Patient  PLAN FOR NEXT SESSION: Review and progress HEP, consider [place], consider yellow putty   Crissie Aloi, OT 02/24/2024, 9:05 AM

## 2024-03-05 ENCOUNTER — Ambulatory Visit: Admitting: Occupational Therapy

## 2024-03-05 ENCOUNTER — Other Ambulatory Visit: Payer: Self-pay | Admitting: Family Medicine

## 2024-03-05 ENCOUNTER — Encounter: Payer: Self-pay | Admitting: *Deleted

## 2024-03-05 DIAGNOSIS — I10 Essential (primary) hypertension: Secondary | ICD-10-CM

## 2024-03-16 ENCOUNTER — Other Ambulatory Visit: Payer: Self-pay

## 2024-03-16 ENCOUNTER — Encounter: Payer: Self-pay | Admitting: Physician Assistant

## 2024-03-16 ENCOUNTER — Ambulatory Visit (INDEPENDENT_AMBULATORY_CARE_PROVIDER_SITE_OTHER): Admitting: Physician Assistant

## 2024-03-16 DIAGNOSIS — M79644 Pain in right finger(s): Secondary | ICD-10-CM | POA: Diagnosis not present

## 2024-03-16 NOTE — Progress Notes (Signed)
 Office Visit Note   Patient: Valerie Walker           Date of Birth: [birth year]           MRN: [id number] Visit Date: 03/16/2024              Requested by: Jobe Mulder, DO [address] [place],  [place] [address] PCP: Jobe Mulder, DO   Assessment & Plan: Visit Diagnoses:  1. Pain of finger of right hand     Plan: Valerie Walker is a pleasant 53 year old woman who is 5 months status post dog bite to her right index finger.  This was treated uneventfully.  Her chief complaint is stiffness in this index finger.  Especially at the PIP joint.  She has near full flexion or extension at this time.  She has been working with occupational therapy.  She just wants to make sure there is nothing else that needs to be done.  Reviewed x-rays and the case with Dr. Merlinda Starling.  He explained that this is a injury sprain that does take almost a year to heal.  Main course of therapy would be occupational therapy.  She should continue to do this.  She could follow-up with Dr. Merlinda Starling in 6 to 8 weeks to check her progress  Follow-Up Instructions: Return in about 8 weeks (around 05/11/2024).   Orders:  Orders Placed This Encounter  Procedures   XR Finger Index Right   No orders of the defined types were placed in this encounter.     Procedures: No procedures performed   Clinical Data: No additional findings.   Subjective: Chief Complaint  Patient presents with   Right Hand - Injury    Pain in right index finger  she was bitten by her dog in November  she is for a  second opinion    Patient is a pleasant 53 year old woman who is 5 months status post being bit by her dog.  She was treated without incident.  She comes in today complaining of increasing stiffness in the index finger.  She is right-hand dominant as a Runner, broadcasting/film/video.  She is working with occupational therapy Injury    Review of Systems  All other systems reviewed and are negative.    Objective: Vital Signs:  There were no vitals taken for this visit.  Physical Exam Constitutional:      Appearance: Normal appearance.  Pulmonary:     Effort: Pulmonary effort is normal.  Skin:    General: Skin is warm and dry.  Neurological:     Mental Status: She is alert and oriented to person, place, and time.     Ortho Exam Examination she is right-hand dominant.  Examination she has no redness no erythema strong pulses.  She has decreased active flexion and extension at the PIP joint of the index finger on the right hand brisk capillary refill that tells hide really feel Valerie Walker Specialty Comments:  No specialty comments available.  Imaging: No results found.   PMFS History: Patient Active Problem List   Diagnosis Date Noted   Tear of right acetabular labrum 02/16/2022   Essential hypertension 09/01/2018   Liver lesion 09/01/2018   History of pulmonary embolus (PE) 09/01/2018   Past Medical History:  Diagnosis Date   Allergy    Asthma    Diverticulitis    History of blood transfusion    Had after delivery of baby   Hx of blood clots 2002  unprovoked   Hypertension    Thyroid  disease     Family History  Problem Relation Age of Onset   Asthma Mother    Hypertension Mother    Kidney disease Mother    Cancer Father        prostate   Hypertension Father    Hypertension Brother    Asthma Son    Asthma Maternal Grandmother    Heart disease Maternal Grandfather    Heart attack Maternal Grandfather    Heart attack Paternal Grandmother    Heart disease Paternal Grandmother    Alcohol abuse Paternal Grandfather    Early death Paternal Grandfather        due to alcohol    History reviewed. No pertinent surgical history. Social History   Occupational History   Not on file  Tobacco Use   Smoking status: Never   Smokeless tobacco: Never  Substance and Sexual Activity   Alcohol use: Never   Drug use: Never   Sexual activity: Not on file

## 2024-04-01 ENCOUNTER — Other Ambulatory Visit: Payer: Self-pay | Admitting: Family Medicine

## 2024-04-01 DIAGNOSIS — I10 Essential (primary) hypertension: Secondary | ICD-10-CM

## 2024-04-06 ENCOUNTER — Telehealth (HOSPITAL_BASED_OUTPATIENT_CLINIC_OR_DEPARTMENT_OTHER): Payer: Self-pay

## 2024-04-06 ENCOUNTER — Ambulatory Visit (INDEPENDENT_AMBULATORY_CARE_PROVIDER_SITE_OTHER): Admitting: Family Medicine

## 2024-04-06 ENCOUNTER — Encounter: Payer: Self-pay | Admitting: Family Medicine

## 2024-04-06 VITALS — BP 124/76 | HR 74 | Temp 98.0°F | Resp 16 | Ht 64.0 in | Wt 148.8 lb

## 2024-04-06 DIAGNOSIS — J069 Acute upper respiratory infection, unspecified: Secondary | ICD-10-CM

## 2024-04-06 DIAGNOSIS — Z1231 Encounter for screening mammogram for malignant neoplasm of breast: Secondary | ICD-10-CM | POA: Diagnosis not present

## 2024-04-06 LAB — POCT INFLUENZA A/B
Influenza A, POC: NEGATIVE
Influenza B, POC: NEGATIVE

## 2024-04-06 LAB — POCT RAPID STREP A (OFFICE): Rapid Strep A Screen: NEGATIVE

## 2024-04-06 NOTE — Addendum Note (Signed)
 Addended by: Jarl Sellitto M on: 04/06/2024 09:46 AM   Modules accepted: Orders

## 2024-04-06 NOTE — Patient Instructions (Addendum)
 Continue to push fluids, practice good hand hygiene, and cover your mouth if you cough.  If you start having fevers, shaking or shortness of breath, seek immediate care.  OK to take Tylenol 1000 mg (2 extra strength tabs) or 975 mg (3 regular strength tabs) every 6 hours as needed.  Call Center for [facility] at [phone] for an appointment.  They are located at [address], [place], [place], [address] (right across the hall from our office).  Let us  know if you need anything.

## 2024-04-06 NOTE — Progress Notes (Signed)
 Chief Complaint  Patient presents with   Sore Throat    Sore Throat     Valerie Walker here for URI complaints.  Duration: 4 days  Associated symptoms: Fever (100.2 F max), rhinorrhea, sore throat, myalgia, and slight cough Denies: sinus congestion, sinus pain, itchy watery eyes, ear pain, ear drainage, wheezing, shortness of breath, and N/V/D Treatment to date: Tylenol, Benadryl Sick contacts: Yes; various maladies at work  Past Medical History:  Diagnosis Date   Allergy    Asthma    Diverticulitis    History of blood transfusion    Had after delivery of baby   Hx of blood clots 2002   unprovoked   Hypertension    Thyroid  disease     Objective BP 124/76 (BP Location: Left Arm, Patient Position: Sitting)   Pulse 74   Temp 98 F (36.7 C) (Oral)   Resp 16   Ht 5\' 4"  (1.626 m)   Wt 148 lb 12.8 oz (67.5 kg)   SpO2 97%   BMI 25.54 kg/m  General: Awake, alert, appears stated age HEENT: AT, , ears patent b/l and TM's neg, nares patent w/o discharge, pharynx pink and without exudates, MMM, no sinus ttp b/l Neck: No masses or asymmetry Heart: RRR Lungs: CTAB, no accessory muscle use Psych: Age appropriate judgment and insight, normal mood and affect  Viral URI with cough  Encounter for screening mammogram for malignant neoplasm of breast - Plan: MM DIGITAL SCREENING BILATERAL  Tylenol prn. Letter for work given. Continue to push fluids, practice good hand hygiene, cover mouth when coughing. MM ordered.  GYN info provided.  She has GI's number, will be off this summer to do.  F/u prn. If starting to experience fevers, shaking, or shortness of breath, seek immediate care. Pt voiced understanding and agreement to the plan.  [name] [place], DO 04/06/24 9:42 AM

## 2024-04-08 ENCOUNTER — Encounter: Payer: Self-pay | Admitting: Family Medicine

## 2024-04-08 ENCOUNTER — Other Ambulatory Visit: Payer: Self-pay | Admitting: Family Medicine

## 2024-04-08 MED ORDER — CEFDINIR 300 MG PO CAPS: 300.0000 mg | ORAL_CAPSULE | Freq: Two times a day (BID) | ORAL | 0 refills | Status: AC

## 2024-04-18 ENCOUNTER — Emergency Department (HOSPITAL_BASED_OUTPATIENT_CLINIC_OR_DEPARTMENT_OTHER)

## 2024-04-18 ENCOUNTER — Other Ambulatory Visit: Payer: Self-pay

## 2024-04-18 ENCOUNTER — Emergency Department (HOSPITAL_BASED_OUTPATIENT_CLINIC_OR_DEPARTMENT_OTHER)
Admission: EM | Admit: 2024-04-18 | Discharge: 2024-04-18 | Disposition: A | Discharge status: Home / Self Care | Attending: Emergency Medicine | Admitting: Emergency Medicine

## 2024-04-18 DIAGNOSIS — D72829 Elevated white blood cell count, unspecified: Secondary | ICD-10-CM | POA: Diagnosis not present

## 2024-04-18 DIAGNOSIS — R0789 Other chest pain: Secondary | ICD-10-CM | POA: Diagnosis not present

## 2024-04-18 DIAGNOSIS — I10 Essential (primary) hypertension: Secondary | ICD-10-CM | POA: Diagnosis not present

## 2024-04-18 DIAGNOSIS — J45909 Unspecified asthma, uncomplicated: Secondary | ICD-10-CM | POA: Insufficient documentation

## 2024-04-18 DIAGNOSIS — Z79899 Other long term (current) drug therapy: Secondary | ICD-10-CM | POA: Insufficient documentation

## 2024-04-18 DIAGNOSIS — R079 Chest pain, unspecified: Secondary | ICD-10-CM | POA: Diagnosis present

## 2024-04-18 LAB — LIPASE, BLOOD: Lipase: 40 U/L (ref 11–51)

## 2024-04-18 LAB — I-STAT CHEM 8, ED
BUN: 13 mg/dL (ref 6–20)
Calcium, Ion: 1.2 mmol/L (ref 1.15–1.40)
Chloride: 104 mmol/L (ref 98–111)
Creatinine, Ser: 0.9 mg/dL (ref 0.44–1.00)
Glucose, Bld: 104 mg/dL — ABNORMAL HIGH (ref 70–99)
HCT: 42 % (ref 36.0–46.0)
Hemoglobin: 14.3 g/dL (ref 12.0–15.0)
Potassium: 3.8 mmol/L (ref 3.5–5.1)
Sodium: 140 mmol/L (ref 135–145)
TCO2: 27 mmol/L (ref 22–32)

## 2024-04-18 LAB — CBC
HCT: 39.8 % (ref 36.0–46.0)
Hemoglobin: 12.9 g/dL (ref 12.0–15.0)
MCH: 28.7 pg (ref 26.0–34.0)
MCHC: 32.4 g/dL (ref 30.0–36.0)
MCV: 88.6 fL (ref 80.0–100.0)
Platelets: 418 10*3/uL — ABNORMAL HIGH (ref 150–400)
RBC: 4.49 MIL/uL (ref 3.87–5.11)
RDW: 12.7 % (ref 11.5–15.5)
WBC: 12.4 10*3/uL — ABNORMAL HIGH (ref 4.0–10.5)
nRBC: 0 % (ref 0.0–0.2)

## 2024-04-18 LAB — COMPREHENSIVE METABOLIC PANEL WITH GFR
ALT: 21 U/L (ref 0–44)
AST: 26 U/L (ref 15–41)
Albumin: 4.3 g/dL (ref 3.5–5.0)
Alkaline Phosphatase: 140 U/L — ABNORMAL HIGH (ref 38–126)
Anion gap: 11 (ref 5–15)
BUN: 12 mg/dL (ref 6–20)
CO2: 26 mmol/L (ref 22–32)
Calcium: 9.6 mg/dL (ref 8.9–10.3)
Chloride: 102 mmol/L (ref 98–111)
Creatinine, Ser: 0.86 mg/dL (ref 0.44–1.00)
GFR, Estimated: >60 mL/min (ref >60)
Glucose, Bld: 107 mg/dL — ABNORMAL HIGH (ref 70–99)
Potassium: 3.9 mmol/L (ref 3.5–5.1)
Sodium: 139 mmol/L (ref 135–145)
Total Bilirubin: 0.4 mg/dL (ref 0.0–1.2)
Total Protein: 7.6 g/dL (ref 6.5–8.1)

## 2024-04-18 LAB — TROPONIN T, HIGH SENSITIVITY: Troponin T High Sensitivity: <15 ng/L (ref <19)

## 2024-04-18 MED ORDER — IOHEXOL 350 MG/ML SOLN
80.0000 mL | Freq: Once | INTRAVENOUS | Status: AC | PRN
  Administered 2024-04-18: 80 mL via INTRAVENOUS

## 2024-04-18 MED ORDER — KETOROLAC TROMETHAMINE 30 MG/ML IJ SOLN
30.0000 mg | Freq: Once | INTRAMUSCULAR | Status: AC
  Administered 2024-04-18: 30 mg via INTRAVENOUS
  Filled 2024-04-18: qty 1

## 2024-04-18 MED ORDER — HYDROCODONE-ACETAMINOPHEN 5-325 MG PO TABS: 1.0000 | ORAL_TABLET | ORAL | 0 refills | Status: DC | PRN

## 2024-04-18 MED ORDER — IBUPROFEN 600 MG PO TABS: 600.0000 mg | ORAL_TABLET | Freq: Four times a day (QID) | ORAL | 0 refills | Status: DC | PRN

## 2024-04-18 NOTE — ED Provider Notes (Signed)
 [place] [facility]MENT AT [facility] Provider Note   CSN: [id number] Arrival date & time: 04/18/24  6578     History  Chief Complaint  Patient presents with   Abdominal Pain    Valerie Walker is a 53 y.o. female.  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  Pt said she has been having right sided chest pain for a week.  She has pain when she takes a deep breath and some soreness to palpation.  She did recently finish abx for a sinus infection. She said she feels like she did when she had her PE.  No n/v.  No f/c.  Sx did start after cleaning her pool.       Home Medications Prior to Admission medications   Medication Sig Start Date End Date Taking? Authorizing Provider  HYDROcodone-acetaminophen (NORCO/VICODIN) 5-325 MG tablet Take 1 tablet by mouth every 4 (four) hours as needed. 04/18/24  Yes Shay Jhaveri, MD  ibuprofen (ADVIL) 600 MG tablet Take 1 tablet (600 mg total) by mouth every 6 (six) hours as needed. 04/18/24  Yes Sueellen Emery, MD  levalbuterol  (XOPENEX  HFA) 45 MCG/ACT inhaler Inhale 1-2 puffs into the lungs every 6 (six) hours as needed for wheezing or shortness of breath. 08/28/22   Jobe Mulder, DO  mometasone  (NASONEX ) 50 MCG/ACT nasal spray PLACE 2 SPRAYS INTO THE NOSE DAILY. 01/07/23   Jobe Mulder, DO  olmesartan  (BENICAR ) 20 MG tablet TAKE 1 TABLET BY MOUTH EVERY DAY 04/01/24   Jobe Mulder, DO  triamcinolone  cream (KENALOG ) 0.1 % Apply 1 application. topically 2 (two) times daily. 01/29/22   Jobe Mulder, DO      Allergies    Doxycycline , Levaquin [levofloxacin in d5w], Penicillins, Erythromycin, Bactrim  [sulfamethoxazole -trimethoprim ], Flagyl  [metronidazole ], and Aspirin    Review of Systems   Review of Systems  Cardiovascular:  Positive for chest pain.  All other systems reviewed and are negative.   Physical  Exam Updated Vital Signs BP (!) 142/103   Pulse 76   Temp 98.5 F (36.9 C) (Oral)   Resp 18   SpO2 100%  Physical Exam Vitals and nursing note reviewed.  Constitutional:      Appearance: She is well-developed.  HENT:     Head: Normocephalic and atraumatic.     Mouth/Throat:     Mouth: Mucous membranes are moist.     Pharynx: Oropharynx is clear.  Eyes:     Extraocular Movements: Extraocular movements intact.     Pupils: Pupils are equal, round, and reactive to light.  Cardiovascular:     Rate and Rhythm: Normal rate and regular rhythm.     Heart sounds: Normal heart sounds.  Pulmonary:     Effort: Pulmonary effort is normal.     Breath sounds: Normal breath sounds.  Chest:     Comments: Right sided chest pain and right sided upper back pain Abdominal:     General: Abdomen is flat. Bowel sounds are normal.     Palpations: Abdomen is soft.  Skin:    General: Skin is warm.     Capillary Refill: Capillary refill takes less than 2 seconds.     Findings: No rash.  Neurological:     General: No focal deficit present.     Mental Status: She is alert and oriented to person, place, and time.     ED Results / Procedures /  Treatments   Labs (all labs ordered are listed, but only abnormal results are displayed) Labs Reviewed  CBC - Abnormal; Notable for the following components:      Result Value   WBC 12.4 (*)    Platelets 418 (*)    All other components within normal limits  COMPREHENSIVE METABOLIC PANEL WITH GFR - Abnormal; Notable for the following components:   Glucose, Bld 107 (*)    Alkaline Phosphatase 140 (*)    All other components within normal limits  I-STAT CHEM 8, ED - Abnormal; Notable for the following components:   Glucose, Bld 104 (*)    All other components within normal limits  LIPASE, BLOOD  TROPONIN T, HIGH SENSITIVITY  TROPONIN T, HIGH SENSITIVITY    EKG EKG Interpretation Date/Time:  Saturday Apr 18 2024 08:46:25 EDT Ventricular Rate:   62 PR Interval:  135 QRS Duration:  92 QT Interval:  418 QTC Calculation: 425 R Axis:   80  Text Interpretation: Sinus rhythm Confirmed by Sueellen Emery [phone]) on 04/18/2024 8:49:24 AM  Radiology CT Angio Chest PE W and/or Wo Contrast Result Date: 04/18/2024 CLINICAL DATA:  Pulmonary embolism (PE) suspected, high prob EXAM: CT ANGIOGRAPHY CHEST WITH CONTRAST TECHNIQUE: Multidetector CT imaging of the chest was performed using the standard protocol during bolus administration of intravenous contrast. Multiplanar CT image reconstructions and MIPs were obtained to evaluate the vascular anatomy. RADIATION DOSE REDUCTION: This exam was performed according to the departmental dose-optimization program which includes automated exposure control, adjustment of the mA and/or kV according to patient size and/or use of iterative reconstruction technique. CONTRAST:  80mL OMNIPAQUE  IOHEXOL  350 MG/ML SOLN COMPARISON:  10/06/2022 and previous FINDINGS: Cardiovascular: Heart size normal. No pericardial effusion. The RV is nondilated. Satisfactory opacification of pulmonary arteries noted, and there is no evidence of pulmonary emboli. Adequate contrast opacification of the thoracic aorta with no evidence of dissection, aneurysm, or stenosis. There is classic 3-vessel brachiocephalic arch anatomy without proximal stenosis. Mediastinum/Nodes: Small hiatal hernia. No hematoma, mass, or adenopathy. Lungs/Pleura: No pleural effusion. Dependent atelectasis posteriorly in the lung bases. No pneumothorax. Upper Abdomen: No acute findings. Musculoskeletal: No chest wall abnormality. No acute or significant osseous findings. Review of the MIP images confirms the above findings. IMPRESSION: 1. Negative for acute PE or thoracic aortic dissection. 2. Small hiatal hernia. Electronically Signed   By: Nicoletta Barrier M.D.   On: 04/18/2024 10:17    Procedures Procedures    Medications Ordered in ED Medications  ketorolac (TORADOL)  30 MG/ML injection 30 mg (30 mg Intravenous Given 04/18/24 0903)  iohexol  (OMNIPAQUE ) 350 MG/ML injection 80 mL (80 mLs Intravenous Contrast Given 04/18/24 6045)    ED Course/ Medical Decision Making/ A&P                                 Medical Decision Making Amount and/or Complexity of Data Reviewed Labs: ordered. Radiology: ordered.  Risk Prescription drug management.   This patient presents to the ED for concern of cp, this involves an extensive number of treatment options, and is a complaint that carries with it a high risk of complications and morbidity.  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  The pertinent results include:  cbc nl other than wbc sl elevated at 12.4, istat neg, cmp nl, lip nl, trop neg   Imaging Studies ordered:  I ordered imaging studies including ct chest  I independently visualized and interpreted imaging which showed  Negative for acute PE or thoracic aortic dissection.  2. Small hiatal hernia.   I agree with the radiologist interpretation   Cardiac Monitoring:  The patient was maintained on a cardiac monitor.  I personally viewed and interpreted the cardiac monitored which showed an underlying rhythm of: nsr   Medicines ordered and prescription drug management:  I ordered medication including toradol  for sx  Reevaluation of the patient after these medicines showed that the patient improved I have reviewed the patients home medicines and have made adjustments as needed   Test Considered:  ct   Problem List / ED Course:  CP:  likely msk.  Cardiac work up neg.  CT chest neg.  Pt is stable for d/c.  Return if worse.      Reevaluation:  After the interventions noted above, I reevaluated the patient and found that they have :improved   Social Determinants of Health:  Lives at home   Dispostion:  After consideration of the diagnostic results and the patients response to treatment, I feel that the patent would benefit from discharge with outpatient f/u.          Final Clinical Impression(s) / ED Diagnoses Final diagnoses:  Chest wall pain    Rx / DC Orders ED Discharge Orders          Ordered    HYDROcodone-acetaminophen (NORCO/VICODIN) 5-325 MG tablet  Every 4 hours PRN        04/18/24 1047    ibuprofen (ADVIL) 600 MG tablet  Every 6 hours PRN        04/18/24 1047              Areya Lemmerman, MD 04/18/24 1050

## 2024-04-18 NOTE — ED Triage Notes (Signed)
 Pt reports RUQ pain that began Tuesday. States that pain under right rib cage is sore to touch.  States she finished antibiotics for sinus infection.    Hx of PE in 2002

## 2024-04-30 ENCOUNTER — Telehealth (HOSPITAL_BASED_OUTPATIENT_CLINIC_OR_DEPARTMENT_OTHER): Payer: Self-pay

## 2024-05-02 ENCOUNTER — Other Ambulatory Visit: Payer: Self-pay | Admitting: Family Medicine

## 2024-05-02 DIAGNOSIS — I10 Essential (primary) hypertension: Secondary | ICD-10-CM

## 2024-05-11 ENCOUNTER — Ambulatory Visit: Admitting: Orthopedic Surgery

## 2024-06-05 ENCOUNTER — Other Ambulatory Visit: Payer: Self-pay | Admitting: Family Medicine

## 2024-06-05 DIAGNOSIS — I10 Essential (primary) hypertension: Secondary | ICD-10-CM

## 2024-07-14 ENCOUNTER — Other Ambulatory Visit: Payer: Self-pay | Admitting: Family Medicine

## 2024-07-14 DIAGNOSIS — I10 Essential (primary) hypertension: Secondary | ICD-10-CM

## 2024-08-19 ENCOUNTER — Other Ambulatory Visit: Payer: Self-pay | Admitting: Family Medicine

## 2024-08-19 DIAGNOSIS — I10 Essential (primary) hypertension: Secondary | ICD-10-CM

## 2024-08-20 ENCOUNTER — Emergency Department (HOSPITAL_BASED_OUTPATIENT_CLINIC_OR_DEPARTMENT_OTHER)
Admission: EM | Admit: 2024-08-20 | Discharge: 2024-08-20 | Disposition: A | Discharge status: Home / Self Care | Source: Ambulatory Visit | Attending: Emergency Medicine | Admitting: Emergency Medicine

## 2024-08-20 ENCOUNTER — Emergency Department (HOSPITAL_BASED_OUTPATIENT_CLINIC_OR_DEPARTMENT_OTHER): Admitting: Radiology

## 2024-08-20 ENCOUNTER — Ambulatory Visit: Payer: Self-pay

## 2024-08-20 ENCOUNTER — Ambulatory Visit: Admitting: Family Medicine

## 2024-08-20 ENCOUNTER — Other Ambulatory Visit: Payer: Self-pay

## 2024-08-20 ENCOUNTER — Encounter (HOSPITAL_BASED_OUTPATIENT_CLINIC_OR_DEPARTMENT_OTHER): Payer: Self-pay | Admitting: *Deleted

## 2024-08-20 VITALS — BP 142/84 | HR 62 | Temp 98.0°F | Ht 64.0 in | Wt 151.0 lb

## 2024-08-20 DIAGNOSIS — Z79899 Other long term (current) drug therapy: Secondary | ICD-10-CM | POA: Insufficient documentation

## 2024-08-20 DIAGNOSIS — R531 Weakness: Secondary | ICD-10-CM

## 2024-08-20 DIAGNOSIS — R002 Palpitations: Secondary | ICD-10-CM | POA: Diagnosis not present

## 2024-08-20 DIAGNOSIS — R110 Nausea: Secondary | ICD-10-CM

## 2024-08-20 DIAGNOSIS — R0789 Other chest pain: Secondary | ICD-10-CM | POA: Diagnosis not present

## 2024-08-20 DIAGNOSIS — Z86711 Personal history of pulmonary embolism: Secondary | ICD-10-CM

## 2024-08-20 DIAGNOSIS — I10 Essential (primary) hypertension: Secondary | ICD-10-CM | POA: Insufficient documentation

## 2024-08-20 DIAGNOSIS — R079 Chest pain, unspecified: Secondary | ICD-10-CM | POA: Insufficient documentation

## 2024-08-20 LAB — BASIC METABOLIC PANEL WITH GFR
Anion gap: 15 (ref 5–15)
BUN: 9 mg/dL (ref 6–20)
CO2: 23 mmol/L (ref 22–32)
Calcium: 10.2 mg/dL (ref 8.9–10.3)
Chloride: 102 mmol/L (ref 98–111)
Creatinine, Ser: 0.83 mg/dL (ref 0.44–1.00)
GFR, Estimated: >60 mL/min (ref >60)
Glucose, Bld: 107 mg/dL — ABNORMAL HIGH (ref 70–99)
Potassium: 3.9 mmol/L (ref 3.5–5.1)
Sodium: 140 mmol/L (ref 135–145)

## 2024-08-20 LAB — CBC
HCT: 42 % (ref 36.0–46.0)
Hemoglobin: 13.9 g/dL (ref 12.0–15.0)
MCH: 29.3 pg (ref 26.0–34.0)
MCHC: 33.1 g/dL (ref 30.0–36.0)
MCV: 88.4 fL (ref 80.0–100.0)
Platelets: 355 K/uL (ref 150–400)
RBC: 4.75 MIL/uL (ref 3.87–5.11)
RDW: 12.6 % (ref 11.5–15.5)
WBC: 10.4 K/uL (ref 4.0–10.5)
nRBC: 0 % (ref 0.0–0.2)

## 2024-08-20 LAB — TROPONIN T, HIGH SENSITIVITY
Troponin T High Sensitivity: <15 ng/L (ref 0–19)
Troponin T High Sensitivity: <15 ng/L (ref 0–19)

## 2024-08-20 LAB — TSH: TSH: 0.587 u[IU]/mL (ref 0.350–4.500)

## 2024-08-20 LAB — D-DIMER, QUANTITATIVE: D-Dimer, Quant: <0.27 ug{FEU}/mL (ref 0.00–0.50)

## 2024-08-20 NOTE — ED Provider Notes (Signed)
 [place] EMERGENCY DEPARTMENT AT [facility] Provider Note   CSN: [id number] Arrival date & time: 08/20/24  1207     Patient presents with: Palpitations and Chest Pain   Valerie Walker is a 53 y.o. female.    Palpitations Associated symptoms: chest pain   Chest Pain Associated symptoms: palpitations     53 year old female presents emergency department with complaints of palpitations, chest discomfort.  Has been noticing spells for the past few days.  Initially occurred when she was eating took Tums which seemed to help.  Reports worsening of symptoms this morning when she was at work.  Works locally as a Runner, broadcasting/film/video.  States that she did take Tums on her way over which seems to have been helping some.  Denies any shortness of breath.  States that she uses a Kardia device at home and states that it said it was abnormal.  Reports history of PE not currently on anticoagulation.  States that when she had a PE, had significant chest discomfort and shortness of breath and this does not feel similar.  Denies any fevers, chills, cough, nausea, vomiting.  States that she is currently endorsing no symptoms.  Past medical history significant for hypertension, PE  Prior to Admission medications   Medication Sig Start Date End Date Taking? Authorizing Provider  ibuprofen  (ADVIL ) 600 MG tablet Take 1 tablet (600 mg total) by mouth every 6 (six) hours as needed. 04/18/24   Dean Clarity, MD  levalbuterol  (XOPENEX  HFA) 45 MCG/ACT inhaler Inhale 1-2 puffs into the lungs every 6 (six) hours as needed for wheezing or shortness of breath. 08/28/22   Frann Mabel Mt, DO  mometasone  (NASONEX ) 50 MCG/ACT nasal spray PLACE 2 SPRAYS INTO THE NOSE DAILY. 01/07/23   Frann Mabel Mt, DO  olmesartan  (BENICAR ) 20 MG tablet TAKE 1 TABLET BY MOUTH EVERY DAY 08/19/24   Frann Mabel Mt, DO  triamcinolone  cream (KENALOG ) 0.1 % Apply 1 application. topically 2 (two) times daily. 01/29/22    Frann Mabel Mt, DO    Allergies: Doxycycline , Levaquin [levofloxacin in d5w], Penicillins, Erythromycin, Bactrim  [sulfamethoxazole -trimethoprim ], Flagyl  [metronidazole ], and Aspirin    Review of Systems  Cardiovascular:  Positive for chest pain and palpitations.  All other systems reviewed and are negative.   Updated Vital Signs BP (!) 177/74 (BP Location: Right Arm)   Pulse 71   Temp 97.9 F (36.6 C) (Oral)   Resp 14   SpO2 97%   Physical Exam Vitals and nursing note reviewed.  Constitutional:      General: She is not in acute distress.    Appearance: She is well-developed.  HENT:     Head: Normocephalic and atraumatic.  Eyes:     Conjunctiva/sclera: Conjunctivae normal.  Cardiovascular:     Rate and Rhythm: Normal rate and regular rhythm.     Pulses: Normal pulses.     Heart sounds: No murmur heard. Pulmonary:     Effort: Pulmonary effort is normal. No respiratory distress.     Breath sounds: Normal breath sounds. No wheezing, rhonchi or rales.  Chest:     Chest wall: No tenderness.  Abdominal:     Palpations: Abdomen is soft.     Tenderness: There is no abdominal tenderness.  Musculoskeletal:        General: No swelling.     Cervical back: Neck supple.     Right lower leg: No edema.     Left lower leg: No edema.  Skin:    General: Skin  is warm and dry.     Capillary Refill: Capillary refill takes less than 2 seconds.  Neurological:     Mental Status: She is alert.  Psychiatric:        Mood and Affect: Mood normal.     (all labs ordered are listed, but only abnormal results are displayed) Labs Reviewed  BASIC METABOLIC PANEL WITH GFR - Abnormal; Notable for the following components:      Result Value   Glucose, Bld 107 (*)    All other components within normal limits  CBC  D-DIMER, QUANTITATIVE  TSH  TROPONIN T, HIGH SENSITIVITY  TROPONIN T, HIGH SENSITIVITY    EKG: None  Radiology: DG Chest 2 View Result Date: 08/20/2024 EXAM: 2  VIEW(S) XRAY OF THE CHEST 08/20/2024 12:34:00 PM COMPARISON: 10/06/22. CLINICAL HISTORY: chest pain FINDINGS: LUNGS AND PLEURA: No focal pulmonary opacity. No pulmonary edema. No pleural effusion. No pneumothorax. HEART AND MEDIASTINUM: No acute abnormality of the cardiac and mediastinal silhouettes. BONES AND SOFT TISSUES: No acute osseous abnormality. IMPRESSION: 1. Normal chest radiograph. No acute cardiopulmonary process. Electronically signed by: Waddell Calk MD 08/20/2024 12:57 PM EDT RP Workstation: [id number]     Procedures   Medications Ordered in the ED - No data to display                                  Medical Decision Making Amount and/or Complexity of Data Reviewed Labs: ordered. Radiology: ordered.   This patient presents to the ED for concern of palpitations, chest discomfort, this involves an extensive number of treatment options, and is a complaint that carries with it a high risk of complications and morbidity.  The differential diagnosis includes arrhythmia, PE, ACS, pericarditis/myocarditis/tamponade, pneumothorax, aortic dissection, GERD, anxiety,   Co morbidities that complicate the patient evaluation  See HPI   Additional history obtained:  Additional history obtained from EMR External records from outside source obtained and reviewed including hospital records   Lab Tests:  I Ordered, and personally interpreted labs.  The pertinent results include: No leukocytosis.  No evidence of anemia.  Platelets within range.  No Electra abnormalities.  No renal dysfunction.  Troponin less than 15 with repeat less than 15.  D-dimer negative.  TSH normal.   Imaging Studies ordered:  I ordered imaging studies including chest x-ray I independently visualized and interpreted imaging which showed no acute cardiopulmonary abnormality I agree with the radiologist interpretation   Cardiac Monitoring: / EKG:  The patient was maintained on a cardiac monitor.  I  personally viewed and interpreted the cardiac monitored which showed an underlying rhythm of: Normal sinus rhythm.  Nonspecific ST changes.   Consultations Obtained:  N/a   Problem List / ED Course / Critical interventions / Medication management  Palpitations Reevaluation of the patient showed that the patient stayed the same I have reviewed the patients home medicines and have made adjustments as needed   Social Determinants of Health:  Denies tobacco,'s or drug use.   Test / Admission - Considered:  Palpitations Vitals signs significant for hypertension. Otherwise within normal range and stable throughout visit. Laboratory/imaging studies significant for: See above 53 year old female presents emergency department with complaints of palpitations, chest discomfort.  Has been noticing spells for the past few days.  Initially occurred when she was eating took Tums which seemed to help.  Reports worsening of symptoms this morning when she was at work.  Works locally as a Runner, broadcasting/film/video.  States that she did take Tums on her way over which seems to have been helping some.  Denies any shortness of breath.  States that she uses a Kardia device at home and states that it said it was abnormal.  Reports history of PE not currently on anticoagulation.  States that when she had a PE, had significant chest discomfort and shortness of breath and this does not feel similar.  Denies any fevers, chills, cough, nausea, vomiting.  States that she is currently endorsing no symptoms. On exam, no abdominal tenderness.  Lungs clear to oscillation bilaterally.  No auscultatory murmur.  Workup today reassuring.  Delta negative troponin, lack of acute ischemic change on EKG; low suspicion for ACS.  D-dimer negative; low suspicion for PE.  Chest x-ray without obvious pneumothorax, pneumonia or other acute cardiopulmonary abnormality.  Per patient's phone, does have Kardia device with evidence of PACs present whenever she  is feeling palpitations.  Concerned this could be causing her palpitations and feeling of chest discomfort or flipping sensation in her chest.  Will refer to cardiology for further assessment potential cardiac monitoring.  Treatment plan discussed with patient she acknowledged understanding was agreeable.  Patient well-appearing, afebrile in no acute distress upon discharge. Worrisome signs and symptoms were discussed with the patient, and the patient acknowledged understanding to return to the ED if noticed. Patient was stable upon discharge.       Final diagnoses:  None    ED Discharge Orders     None          Silver Wonda LABOR, [place] 08/20/24 1644    Mannie Pac T, DO 08/24/24 2327

## 2024-08-20 NOTE — ED Triage Notes (Addendum)
 Pt to ED reporting palpitations and chest discomfort x 2 days that have worsened. Pt took two TUMS that decreased the pain but it has since returned.. NO shortness of breath.  Hx of PE

## 2024-08-20 NOTE — Discharge Instructions (Addendum)
 As discussed, your workup today was reassuring.  Your heart enzymes were normal EKG showed no obvious abnormality; does not appear that you are having a heart attack.  Your D-dimer was normal; I do not think you have a blood clot in the lung.  Chest x-ray showed no obvious pneumonia, collapsed lung or other acute cardiopulmonary abnormality.  Concerned that you may be having arrhythmia or extra heartbeats.  We will send in a referral for cardiology for cardiac monitoring.

## 2024-08-20 NOTE — Progress Notes (Signed)
 Established Patient Office Visit   Subjective:  Patient ID: Valerie Walker, female    DOB: 1971-08-28  Age: 53 y.o. MRN: [id number]  Chief Complaint  Patient presents with   Palpitations    Palpitations    Patient is presenting with complaints of palpitations. Patient reports right after eating orange chicken with yum yum sauce at school on Tuesday she started having palpitations and belching. She is allergic to MSG and thought it was something related to the food. On Tuesday, she drunk plenty of water as she done previously for allergic reaction to MSG and it helped a little. During Tuesday night, she didn't have any palpitations. Yesterday morning she woke up with mild palpitations and lasted throughout the day. Felt the palpations till she went to sleep last night. This morning she woke up with mild palpitations and felt fatigued. 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 She took 2 Tums prior to arrival and this has seemed to help some with the palpitations. Patient reports her chest is feeling generalized tight and tightness becomes worse with belching.   Reports SHOB occurred when the palpitation where at their worst this morning. She reports she does not feel the best, generalized weakness. Denies dizziness, lightheadedness. Reports a intermittent head pressure that last maybe a minute. Reports she is nausea with no vomiting.   Currently, does not have consistent palpitations, but feels a flutter every now and then.   As for her medication, she has only had the Olmesartan  and Kenalog  cream. She does have a history of PEs.  Review of Systems  Cardiovascular:  Positive for palpitations.   See HPI above     Objective:   BP (!) 142/84   Pulse 62   Temp 98 F (36.7 C) (Oral)   Ht 5' 4 (1.626 m)   Wt 151 lb (68.5 kg)   SpO2 99%   BMI 25.92 kg/m    Physical Exam Vitals reviewed.  Constitutional:       General: She is not in acute distress.    Appearance: Normal appearance. She is overweight. She is not ill-appearing, toxic-appearing or diaphoretic.  HENT:     Head: Normocephalic and atraumatic.  Eyes:     General:        Right eye: No discharge.        Left eye: No discharge.     Conjunctiva/sclera: Conjunctivae normal.  Cardiovascular:     Rate and Rhythm: Bradycardia present.     Heart sounds: Normal heart sounds. No murmur heard.    No friction rub. No gallop.  Pulmonary:     Effort: Pulmonary effort is normal. No respiratory distress.     Breath sounds: Normal breath sounds.  Musculoskeletal:        General: Normal range of motion.     Right lower leg: No edema.     Left lower leg: No edema.  Skin:    General: Skin is warm and dry.  Neurological:     General: No focal deficit present.     Mental Status: She is alert and oriented to person, place, and time. Mental status is at baseline.     Gait: Gait normal.  Psychiatric:        Mood and Affect: Mood normal.        Behavior: Behavior normal.        Thought Content: Thought content normal.  Judgment: Judgment normal.    EKG completed for reports of palpitations and chest tightness. Sinus brady, HR-57, no ST elevation. EKG has no significant changes compared to the previous EKG on 04/18/2024, except HR has dropped to 57 from 62.     Assessment & Plan:  Palpitation -     EKG 12-Lead  Chest tightness -     EKG 12-Lead  Generalized weakness  Nausea  History of pulmonary embolus (PE)  -Strongly encourage her to go directly to the closes emergency department,which is [facility]  -Concerned with intensity of palpitations earlier today, generalized chest tightness, nausea, generalized weakness, and history of blood clot that this could be cardiac related or even pulmonary related. Unable to rule this out in office in a timely manner.  -Symptoms have become worse since Tuesday. EkG  completed, sinus brady, with no significant change from previous EKG.   Ramil Edgington, NP

## 2024-08-20 NOTE — Telephone Encounter (Signed)
 Appt scheduled

## 2024-08-20 NOTE — ED Notes (Signed)
 BLUE TOP SENT TO LAB

## 2024-08-20 NOTE — Telephone Encounter (Signed)
 FYI Only or Action Required?: FYI only for provider.  Patient was last seen in primary care on 04/06/2024 by Frann Mabel Mt, DO.  Called Nurse Triage reporting Palpitations.  Symptoms began several days ago.  Interventions attempted: Rest, hydration, or home remedies.  Symptoms are: unchanged.  Triage Disposition: See HCP Within 4 Hours (Or PCP Triage)  Patient/caregiver understands and will follow disposition?: Yes   Copied from CRM [id number]. Topic: Clinical - Red Word Triage >> Aug 20, 2024  9:31 AM Laymon HERO wrote: Red Word that prompted transfer to Nurse Triage: heart palpations- started few days ago Reason for Disposition  [1] Skipped or extra beat(s) AND [2] occurs 4 or more times per minute  Answer Assessment - Initial Assessment Questions Additional info: 1) Reports history of PE. Denies chest pain & shortness of breath.  2) No sdv available at pcp practice location, scheduled at alternate regional office, patient aware of office location and provider.    1. DESCRIPTION: Please describe your heart rate or heartbeat that you are having (e.g., fast/slow, regular/irregular, skipped or extra beats, palpitations)     palpitations 2. ONSET: When did it start? (e.g., minutes, hours, days)      Monday 3. DURATION: How long does it last (e.g., seconds, minutes, hours)     ongoing 4. PATTERN Does it come and go, or has it been constant since it started?  Does it get worse with exertion?   Are you feeling it now?     Constantly  5. TAP: Using your hand, can you tap out what you are feeling on a chair or table in front of you, so that I can hear? Note: Not all patients can do this.        6. HEART RATE: Can you tell me your heart rate? How many beats in 15 seconds?  Note: Not all patients can do this.       unsure 7. RECURRENT SYMPTOM: Have you ever had this before? If Yes, ask: When was the last time? and What happened that time?      Many  years ago 8. CAUSE: What do you think is causing the palpitations?     She ate food, very sensitive to msg 9. CARDIAC HISTORY: Do you have any history of heart disease? (e.g., heart attack, angina, bypass surgery, angioplasty, arrhythmia)      htn 10. OTHER SYMPTOMS: Do you have any other symptoms? (e.g., dizziness, chest pain, sweating, difficulty breathing)       Denies.  Protocols used: Heart Rate and Heartbeat Questions-A-AH

## 2024-08-20 NOTE — Patient Instructions (Addendum)
-  Strongly encourage you go directly to the closes emergency department which is [facility] at [facility].  -Concerned with intensity of palpitations earlier today, generalized chest tightness, nausea, generalized weakness, and history of blood clot that this could be cardiac related or even pulmonary related. Unable to rule this out in office.  -It was a pleasure to meet you and hope all is a good outcome.

## 2024-08-31 ENCOUNTER — Ambulatory Visit: Admitting: Family Medicine

## 2024-08-31 ENCOUNTER — Encounter: Payer: Self-pay | Admitting: Family Medicine

## 2024-08-31 VITALS — BP 126/82 | HR 85 | Temp 97.7°F | Resp 16 | Ht 64.0 in | Wt 149.6 lb

## 2024-08-31 DIAGNOSIS — R059 Cough, unspecified: Secondary | ICD-10-CM

## 2024-08-31 DIAGNOSIS — J329 Chronic sinusitis, unspecified: Secondary | ICD-10-CM | POA: Diagnosis not present

## 2024-08-31 LAB — POCT INFLUENZA A/B

## 2024-08-31 LAB — POC COVID19 BINAXNOW: SARS Coronavirus 2 Ag: NEGATIVE

## 2024-08-31 MED ORDER — METHYLPREDNISOLONE ACETATE 80 MG/ML IJ SUSP
80.0000 mg | Freq: Once | INTRAMUSCULAR | Status: AC
  Administered 2024-08-31: 80 mg via INTRAMUSCULAR

## 2024-08-31 MED ORDER — MOMETASONE FUROATE 50 MCG/ACT NA SUSP: 2.0000 | Freq: Every day | NASAL | 12 refills | Status: AC

## 2024-08-31 NOTE — Progress Notes (Signed)
 Chief Complaint  Patient presents with   Sinusitis    Sinus     Powell GORMAN Bare here for URI complaints.  Duration: 4 days  Associated symptoms: sinus congestion, sinus pain, rhinorrhea, sore throat, chest tightness, and cough Denies: itchy watery eyes, ear pain, ear drainage, wheezing, shortness of breath, myalgia, and fevers Treatment to date: Tylenol  Sick contacts: many sick contacts  Past Medical History:  Diagnosis Date   Allergy    Asthma    Diverticulitis    History of blood transfusion    Had after delivery of baby   Hx of blood clots 2002   unprovoked   Hypertension    Thyroid  disease     Objective BP 126/82 (BP Location: Left Arm, Patient Position: Sitting)   Pulse 85   Temp 97.7 F (36.5 C) (Temporal)   Resp 16   Ht 5' 4 (1.626 m)   Wt 149 lb 9.6 oz (67.9 kg)   SpO2 97%   BMI 25.68 kg/m  General: Awake, alert, appears stated age HEENT: AT, [place], ears patent b/l and TM's neg, nares patent w/o discharge, pharynx pink and without exudates, MMM, no sinus ttp Neck: No masses or asymmetry Heart: RRR Lungs: CTAB, no accessory muscle use Psych: Age appropriate judgment and insight, normal mood and affect  Recurrent sinusitis - Plan: mometasone  (NASONEX ) 50 MCG/ACT nasal spray  Depomedrol IM 80 mg today. Continue to push fluids, practice good hand hygiene, cover mouth when coughing. F/u prn. If starting to experience fevers, shaking, or shortness of breath, seek immediate care. Pt voiced understanding and agreement to the plan.  Mabel Mt [place], DO 08/31/24 2:26 PM

## 2024-08-31 NOTE — Addendum Note (Signed)
 Addended by: Suhas Estis M on: 08/31/2024 02:30 PM   Modules accepted: Orders

## 2024-08-31 NOTE — Patient Instructions (Addendum)
 Continue to push fluids, practice good hand hygiene, and cover your mouth if you cough.  If you start having fevers, shaking or shortness of breath, seek immediate care.  OK to take Tylenol  1000 mg (2 extra strength tabs) or 975 mg (3 regular strength tabs) every 6 hours as needed.  Consider adding Mucinex.   Let us  know if you need anything.

## 2024-09-02 ENCOUNTER — Encounter: Payer: Self-pay | Admitting: Family Medicine

## 2024-09-02 ENCOUNTER — Other Ambulatory Visit: Payer: Self-pay | Admitting: Family Medicine

## 2024-09-02 MED ORDER — AZITHROMYCIN 250 MG PO TABS: ORAL_TABLET | ORAL | 0 refills | Status: DC

## 2024-09-12 ENCOUNTER — Emergency Department (HOSPITAL_BASED_OUTPATIENT_CLINIC_OR_DEPARTMENT_OTHER)
Admission: EM | Admit: 2024-09-12 | Discharge: 2024-09-12 | Disposition: A | Discharge status: Home / Self Care | Attending: Emergency Medicine | Admitting: Emergency Medicine

## 2024-09-12 ENCOUNTER — Other Ambulatory Visit: Payer: Self-pay

## 2024-09-12 ENCOUNTER — Encounter (HOSPITAL_BASED_OUTPATIENT_CLINIC_OR_DEPARTMENT_OTHER): Payer: Self-pay

## 2024-09-12 ENCOUNTER — Emergency Department (HOSPITAL_BASED_OUTPATIENT_CLINIC_OR_DEPARTMENT_OTHER)

## 2024-09-12 DIAGNOSIS — D72829 Elevated white blood cell count, unspecified: Secondary | ICD-10-CM | POA: Diagnosis not present

## 2024-09-12 DIAGNOSIS — R0789 Other chest pain: Secondary | ICD-10-CM | POA: Insufficient documentation

## 2024-09-12 DIAGNOSIS — J45909 Unspecified asthma, uncomplicated: Secondary | ICD-10-CM | POA: Diagnosis not present

## 2024-09-12 DIAGNOSIS — I10 Essential (primary) hypertension: Secondary | ICD-10-CM | POA: Insufficient documentation

## 2024-09-12 LAB — HEPATIC FUNCTION PANEL
ALT: 14 U/L (ref 0–44)
AST: 21 U/L (ref 15–41)
Albumin: 4.8 g/dL (ref 3.5–5.0)
Alkaline Phosphatase: 122 U/L (ref 38–126)
Bilirubin, Direct: 0.2 mg/dL (ref 0.0–0.2)
Indirect Bilirubin: 0.4 mg/dL (ref 0.3–0.9)
Total Bilirubin: 0.6 mg/dL (ref 0.0–1.2)
Total Protein: 8.1 g/dL (ref 6.5–8.1)

## 2024-09-12 LAB — CBC
HCT: 42.6 % (ref 36.0–46.0)
Hemoglobin: 14.1 g/dL (ref 12.0–15.0)
MCH: 28.8 pg (ref 26.0–34.0)
MCHC: 33.1 g/dL (ref 30.0–36.0)
MCV: 86.9 fL (ref 80.0–100.0)
Platelets: 470 K/uL — ABNORMAL HIGH (ref 150–400)
RBC: 4.9 MIL/uL (ref 3.87–5.11)
RDW: 12.6 % (ref 11.5–15.5)
WBC: 13.5 K/uL — ABNORMAL HIGH (ref 4.0–10.5)
nRBC: 0 % (ref 0.0–0.2)

## 2024-09-12 LAB — BASIC METABOLIC PANEL WITH GFR
Anion gap: 14 (ref 5–15)
BUN: 12 mg/dL (ref 6–20)
CO2: 26 mmol/L (ref 22–32)
Calcium: 10.6 mg/dL — ABNORMAL HIGH (ref 8.9–10.3)
Chloride: 100 mmol/L (ref 98–111)
Creatinine, Ser: 0.84 mg/dL (ref 0.44–1.00)
GFR, Estimated: >60 mL/min (ref >60)
Glucose, Bld: 120 mg/dL — ABNORMAL HIGH (ref 70–99)
Potassium: 3.5 mmol/L (ref 3.5–5.1)
Sodium: 139 mmol/L (ref 135–145)

## 2024-09-12 LAB — TROPONIN T, HIGH SENSITIVITY
Troponin T High Sensitivity: <15 ng/L (ref 0–19)
Troponin T High Sensitivity: <15 ng/L (ref 0–19)

## 2024-09-12 LAB — D-DIMER, QUANTITATIVE: D-Dimer, Quant: 0.32 ug{FEU}/mL (ref 0.00–0.50)

## 2024-09-12 LAB — LIPASE, BLOOD: Lipase: 54 U/L — ABNORMAL HIGH (ref 11–51)

## 2024-09-12 MED ORDER — KETOROLAC TROMETHAMINE 30 MG/ML IJ SOLN
30.0000 mg | Freq: Once | INTRAMUSCULAR | Status: AC
  Administered 2024-09-12: 30 mg via INTRAVENOUS
  Filled 2024-09-12: qty 1

## 2024-09-12 MED ORDER — ONDANSETRON 4 MG PO TBDP: 4.0000 mg | ORAL_TABLET | Freq: Three times a day (TID) | ORAL | 0 refills | Status: AC | PRN

## 2024-09-12 MED ORDER — METHOCARBAMOL 500 MG PO TABS: 500.0000 mg | ORAL_TABLET | Freq: Three times a day (TID) | ORAL | 0 refills | Status: DC | PRN

## 2024-09-12 MED ORDER — DICYCLOMINE HCL 20 MG PO TABS
20.0000 mg | ORAL_TABLET | Freq: Three times a day (TID) | ORAL | 0 refills | Status: DC | PRN
Start: 2024-09-12 — End: 2024-09-14

## 2024-09-12 MED ORDER — METHOCARBAMOL 500 MG PO TABS
500.0000 mg | ORAL_TABLET | Freq: Once | ORAL | Status: AC
  Administered 2024-09-12: 500 mg via ORAL
  Filled 2024-09-12: qty 1

## 2024-09-12 NOTE — ED Triage Notes (Signed)
 L sided chest pain radiates to back, N/V for 3 days.   Denies [facility]

## 2024-09-12 NOTE — Discharge Instructions (Signed)

## 2024-09-12 NOTE — ED Notes (Signed)
 Pt alert and oriented X 4 at the time of discharge. RR even and unlabored. No acute distress noted. Pt verbalized understanding of discharge instructions as discussed. Pt ambulatory to lobby at time of discharge.

## 2024-09-12 NOTE — ED Provider Notes (Signed)
 Emergency Department Provider Note   I have reviewed the triage vital signs and the nursing notes.   HISTORY  Chief Complaint Chest Pain   HPI Valerie Walker is a 53 y.o. female past history reviewed below presents emergency department with left-sided chest pain radiating to the back.  Patient has had 2 days of intermittent symptoms.  No exertional symptoms.  She seems to have worsening pain with touching the area but also with any food intake.  She has had several small-volume vomiting episodes.  No diaphoresis.  She describes a bandlike pain coming from around her back and around laterally to the anterior chest.  No rash.  She has tried [facility] pain medications and therapies at home without relief.  She has been seen in the emergency department previously with more right sided pain including a PET scan earlier this year which was negative.  She does have a remote history of PE.  She states this episode does not feel like that because she is not short of breath.  She was referred to cardiology during her last ED visit but states the clinic called to cancel the appointment and she has not been able to get back in touch with their office.   Past Medical History:  Diagnosis Date   Allergy    Asthma    Diverticulitis    History of blood transfusion    Had after delivery of baby   Hx of blood clots 2002   unprovoked   Hypertension    Thyroid  disease     Review of Systems  Constitutional: No fever/chills Cardiovascular: Positive chest pain. Respiratory: Denies shortness of breath. Gastrointestinal: No abdominal pain. Positive nausea and vomiting.  Musculoskeletal: Positive for back pain. Skin: Negative for rash. Neurological: Negative for headaches, focal weakness or numbness.  ____________________________________________   PHYSICAL EXAM:  VITAL SIGNS: ED Triage Vitals  Encounter Vitals Group     BP 09/12/24 0725 (!) 164/73     Pulse Rate 09/12/24 0725 63     Resp  09/12/24 0725 12     Temp 09/12/24 0725 98.6 F (37 C)     Temp Source 09/12/24 0725 Oral     SpO2 09/12/24 0725 100 %   Constitutional: Alert and oriented. Well appearing and in no acute distress. Eyes: Conjunctivae are normal. Head: Atraumatic. Nose: No congestion/rhinnorhea. Mouth/Throat: Mucous membranes are moist.  Neck: No stridor.   Cardiovascular: Normal rate, regular rhythm. Good peripheral circulation. Grossly normal heart sounds.   Respiratory: Normal respiratory effort.  No retractions. Lungs CTAB. Gastrointestinal: Soft and nontender. No distention.  Musculoskeletal:  No gross deformities of extremities. Neurologic:  Normal speech and language.  Skin:  Skin is warm, dry and intact. No rash noted over area of pain.   ____________________________________________   LABS (all labs ordered are listed, but only abnormal results are displayed)  Labs Reviewed  CBC - Abnormal; Notable for the following components:      Result Value   WBC 13.5 (*)    Platelets 470 (*)    All other components within normal limits  BASIC METABOLIC PANEL WITH GFR  HEPATIC FUNCTION PANEL  LIPASE, BLOOD  D-DIMER, QUANTITATIVE  TROPONIN T, HIGH SENSITIVITY   ____________________________________________  EKG   EKG Interpretation Date/Time:  Saturday September 12 2024 07:26:08 EDT Ventricular Rate:  67 PR Interval:  143 QRS Duration:  94 QT Interval:  406 QTC Calculation: 429 R Axis:   78  Text Interpretation: Sinus arrhythmia Consider left atrial  enlargement Minimal ST depression, lateral leads No significant change since Sept 2025 tracing Confirmed by Darra Chew [phone]) on 09/12/2024 7:29:18 AM        ____________________________________________  RADIOLOGY  No results found.  ____________________________________________   PROCEDURES  Procedure(s) performed:   Procedures  None  ____________________________________________   INITIAL IMPRESSION / ASSESSMENT AND  PLAN / ED COURSE  Pertinent labs & imaging results that were available during my care of the patient were reviewed by me and considered in my medical decision making (see chart for details).   This patient is Presenting for Evaluation of CP, which does require a range of treatment options, and is a complaint that involves a high risk of morbidity and mortality.  The Differential Diagnoses includes but is not exclusive to acute coronary syndrome, aortic dissection, pulmonary embolism, cardiac tamponade, community-acquired pneumonia, pericarditis, musculoskeletal chest wall pain, etc.   Critical Interventions-    Medications - No data to display  Reassessment after intervention:    I decided to review pertinent External Data, and in summary patient with two prior ED visits this year with CP/abd pain.   Clinical Laboratory Tests Ordered, included ***  Radiologic Tests Ordered, included CXR. I independently interpreted the images and agree with radiology interpretation.   Cardiac Monitor Tracing which shows NSR.    Social Determinants of Health Risk patient is a non-smoker.   Consult complete with  Medical Decision Making: Summary:  Patient presents the emergency department with chest pain on the left side.  Somewhat reproducible on my exam but no crepitus.  No overlying rash to suspect zoster.  Patient has prior history of PE.  Plan to screen with D-dimer as was done in the past.  Overall she seems low likelihood for PE given prior experience, different pain today, normal vitals.  Plan for ACS evaluation.  She does have some nonspecific ST depressions but that appears stable from her tracing in September.   Reevaluation with update and discussion with   ***Considered admission***  Patient's presentation is most consistent with acute presentation with potential threat to life or bodily function.   Disposition:   ____________________________________________  FINAL CLINICAL  IMPRESSION(S) / ED DIAGNOSES  Final diagnoses:  None     NEW OUTPATIENT MEDICATIONS STARTED DURING THIS VISIT:  New Prescriptions   No medications on file    Note:  This document was prepared using Dragon voice recognition software and may include unintentional dictation errors.  Chew Darra, MD, [facility]

## 2024-09-14 ENCOUNTER — Ambulatory Visit: Payer: Self-pay | Admitting: Family Medicine

## 2024-09-14 ENCOUNTER — Ambulatory Visit: Admitting: Family Medicine

## 2024-09-14 VITALS — BP 126/80 | HR 84 | Temp 98.0°F | Resp 16 | Ht 64.0 in | Wt 146.0 lb

## 2024-09-14 DIAGNOSIS — R7989 Other specified abnormal findings of blood chemistry: Secondary | ICD-10-CM

## 2024-09-14 DIAGNOSIS — B029 Zoster without complications: Secondary | ICD-10-CM

## 2024-09-14 LAB — CBC
HCT: 41 % (ref 36.0–46.0)
Hemoglobin: 13.4 g/dL (ref 12.0–15.0)
MCHC: 32.8 g/dL (ref 30.0–36.0)
MCV: 87.2 fl (ref 78.0–100.0)
Platelets: 378 K/uL (ref 150.0–400.0)
RBC: 4.71 Mil/uL (ref 3.87–5.11)
RDW: 12.9 % (ref 11.5–15.5)
WBC: 9.4 K/uL (ref 4.0–10.5)

## 2024-09-14 MED ORDER — AMITRIPTYLINE HCL 10 MG PO TABS
10.0000 mg | ORAL_TABLET | Freq: Every day | ORAL | 0 refills | Status: AC
Start: 2024-09-14 — End: ?

## 2024-09-14 MED ORDER — VALACYCLOVIR HCL 1 G PO TABS: 1000.0000 mg | ORAL_TABLET | Freq: Three times a day (TID) | ORAL | 0 refills | Status: AC

## 2024-09-14 NOTE — Patient Instructions (Addendum)
 Give us  2-3 business days to get the results of your labs back.   If platelets are elevated again, we will refer you to a hematologist.   Consider topical capsaicin cream (this burns going on).   Please contact the GI team at: [phone]  Please schedule your mammogram.   Let us  know if you need anything.

## 2024-09-14 NOTE — Progress Notes (Signed)
 Chief Complaint  Patient presents with   Follow-up    ER Follow Up    Subjective: Patient is a 53 y.o. female here for ED f/u.  Patient went to [facility] ER on 09/12/2024 for left-sided chest pain.  Workup was unremarkable.  She was prescribed Bentyl, Robaxin, and Zofran.  D-dimer was negative as well as her chest x-ray.  She does have a history of a PE.  Since that time, she reports the development of blisters.  No CP, SOB, fevers, coughing.   Past Medical History:  Diagnosis Date   Allergy    Asthma    Diverticulitis    History of blood transfusion    Had after delivery of baby   Hx of blood clots 2002   unprovoked   Hypertension    Thyroid  disease     Objective: BP 126/80 (BP Location: Left Arm, Patient Position: Sitting)   Pulse 84   Temp 98 F (36.7 C) (Oral)   Resp 16   Ht 5' 4 (1.626 m)   Wt 146 lb (66.2 kg)   SpO2 98%   BMI 25.06 kg/m  General: Awake, appears stated age Skin: Over the T3 dermatome, there are pink patches with overlying papules and vesicles.  No streaking redness, purulent drainage, pustules. Lungs: No accessory muscle use Psych: Age appropriate judgment and insight, normal affect and mood  Assessment and Plan: Herpes zoster without complication - Plan: valACYclovir (VALTREX) 1000 MG tablet, amitriptyline (ELAVIL) 10 MG tablet  Elevated platelet count - Plan: CBC  7 days of Valtrex 1 g 3 times daily.  Amitriptyline 10 mg nightly.  Hopefully she does not need more than 30 days of this.  She has not been sleeping well because of this so taking this at night will hopefully help with that. Follow-up on abnormal platelet count from the ED.  If still elevated, will refer to otology given her history of a PE.  Would also have her take a baby aspirin daily. The patient voiced understanding and agreement to the plan.  Mabel Mt [place], DO 09/14/24  12:01 PM

## 2024-09-16 ENCOUNTER — Encounter: Payer: Self-pay | Admitting: Family Medicine

## 2024-09-22 ENCOUNTER — Encounter: Payer: Self-pay | Admitting: Family Medicine

## 2024-09-24 ENCOUNTER — Encounter: Payer: Self-pay | Admitting: Emergency Medicine

## 2024-09-27 ENCOUNTER — Other Ambulatory Visit: Payer: Self-pay | Admitting: Family Medicine

## 2024-09-27 DIAGNOSIS — I10 Essential (primary) hypertension: Secondary | ICD-10-CM

## 2024-09-28 ENCOUNTER — Encounter: Payer: Self-pay | Admitting: Radiology

## 2024-09-28 DIAGNOSIS — J45909 Unspecified asthma, uncomplicated: Secondary | ICD-10-CM | POA: Insufficient documentation

## 2024-09-28 DIAGNOSIS — T7840XA Allergy, unspecified, initial encounter: Secondary | ICD-10-CM | POA: Insufficient documentation

## 2024-09-28 DIAGNOSIS — E079 Disorder of thyroid, unspecified: Secondary | ICD-10-CM | POA: Insufficient documentation

## 2024-09-28 DIAGNOSIS — Z9289 Personal history of other medical treatment: Secondary | ICD-10-CM | POA: Insufficient documentation

## 2024-09-28 DIAGNOSIS — K5792 Diverticulitis of intestine, part unspecified, without perforation or abscess without bleeding: Secondary | ICD-10-CM | POA: Insufficient documentation

## 2024-09-30 ENCOUNTER — Encounter: Payer: Self-pay | Admitting: Family Medicine

## 2024-09-30 ENCOUNTER — Other Ambulatory Visit: Payer: Self-pay | Admitting: Family Medicine

## 2024-09-30 ENCOUNTER — Ambulatory Visit

## 2024-09-30 VITALS — BP 134/82 | HR 79 | Ht 64.0 in | Wt 139.1 lb

## 2024-09-30 DIAGNOSIS — K449 Diaphragmatic hernia without obstruction or gangrene: Secondary | ICD-10-CM

## 2024-09-30 DIAGNOSIS — R002 Palpitations: Secondary | ICD-10-CM

## 2024-09-30 DIAGNOSIS — I2510 Atherosclerotic heart disease of native coronary artery without angina pectoris: Secondary | ICD-10-CM | POA: Insufficient documentation

## 2024-09-30 DIAGNOSIS — I10 Essential (primary) hypertension: Secondary | ICD-10-CM

## 2024-09-30 DIAGNOSIS — R6889 Other general symptoms and signs: Secondary | ICD-10-CM

## 2024-09-30 HISTORY — DX: Diaphragmatic hernia without obstruction or gangrene: K44.9

## 2024-09-30 HISTORY — DX: Palpitations: R00.2

## 2024-09-30 HISTORY — DX: Other general symptoms and signs: R68.89

## 2024-09-30 HISTORY — DX: Atherosclerotic heart disease of native coronary artery without angina pectoris: I25.10

## 2024-09-30 NOTE — Assessment & Plan Note (Signed)
 Appears related with isolated PACs and PVCs on Kardia mobile tracings reviewed on her phone. Reassured her. EKG otherwise without any major abnormalities.  Will obtain Zio patch for 14 days.  Reviewed potential for ectopic beats being triggered by dehydration, stress, lack of sleep, stimulants such as coffee and tea, caffeinated sodas.  Will evaluate cardiac structure and function with an echocardiogram.

## 2024-09-30 NOTE — Addendum Note (Signed)
 Addended by: SHERRE ADE I on: 09/30/2024 10:35 AM   Modules accepted: Orders

## 2024-09-30 NOTE — Assessment & Plan Note (Signed)
 Reports exercise intolerance with lightheadedness after a treadmill activities.  Likely related to deconditioning as she does not routinely exercise.  Will obtain objective assessment with treadmill EKG stress test.

## 2024-09-30 NOTE — Patient Instructions (Signed)
 Medication Instructions:  Your physician recommends that you continue on your current medications as directed. Please refer to the Current Medication list given to you today.  *If you need a refill on your cardiac medications before your next appointment, please call your pharmacy*  Lab Work: Your physician recommends that you return for lab work in:   Labs in the next few days: CMP, Lipids  If you have labs (blood work) drawn today and your tests are completely normal, you will receive your results only by: MyChart Message (if you have MyChart) OR A paper copy in the mail If you have any lab test that is abnormal or we need to change your treatment, we will call you to review the results.  Testing/Procedures: A zio monitor was ordered today. It will remain on for 14 days. You will then return monitor and event diary in provided box. It takes 1-2 weeks for report to be downloaded and returned to us . We will call you with the results. If monitor falls off or has orange flashing light, please call Zio for further instructions.   Treadmill Stress Test Instructions:    1. You may take all of your medications except.  2. No food, drink or tobacco products 2 hours prior to your test.  3. Dress prepared to exercise. Best to wear 2 piece outfit and tennis shoes. Shoes must be closed toe.  4. Please bring all current prescription medications.  Your physician has requested that you have an echocardiogram. Echocardiography is a painless test that uses sound waves to create images of your heart. It provides your doctor with information about the size and shape of your heart and how well your heart's chambers and valves are working. This procedure takes approximately one hour. There are no restrictions for this procedure. Please do NOT wear cologne, perfume, aftershave, or lotions (deodorant is allowed). Please arrive 15 minutes prior to your appointment time.  Please note: We ask at that you not  bring children with you during ultrasound (echo/ vascular) testing. Due to room size and safety concerns, children are not allowed in the ultrasound rooms during exams. Our front office staff cannot provide observation of children in our lobby area while testing is being conducted. An adult accompanying a patient to their appointment will only be allowed in the ultrasound room at the discretion of the ultrasound technician under special circumstances. We apologize for any inconvenience.    Follow-Up: At [facility], you and your health needs are our priority.  As part of our continuing mission to provide you with exceptional heart care, our providers are all part of one team.  This team includes your primary Cardiologist (physician) and Advanced Practice Providers or APPs (Physician Assistants and Nurse Practitioners) who all work together to provide you with the care you need, when you need it.  Your next appointment:   6 week(s)  Provider:   Alean Kobus, MD    We recommend signing up for the patient portal called MyChart.  Sign up information is provided on this After Visit Summary.  MyChart is used to connect with patients for Virtual Visits (Telemedicine).  Patients are able to view lab/test results, encounter notes, upcoming appointments, etc.  Non-urgent messages can be sent to your provider as well.   To learn more about what you can do with MyChart, go to [email or web address].   Other Instructions None

## 2024-09-30 NOTE — Assessment & Plan Note (Signed)
 Small hiatal hernia noted on prior imaging studies, now with excess symptoms of reflux, ongoing epigastric discomfort likely result of gastroesophageal reflux disease.  If symptoms are acutely worsening would recommend further evaluation emergently in the ER.  She is going to reach out to gastroenterology team to expedite her visit if possible.  Recommended to trial over-the-counter antacid such or proton pump inhibitor such as Prilosec. Recommended to reach out to her PCP.

## 2024-09-30 NOTE — Assessment & Plan Note (Signed)
 Reports usually well-controlled. Slightly suboptimal today. Target blood pressure 130/80 mmHg. Continue olmesartan  20 mg once daily.

## 2024-09-30 NOTE — Progress Notes (Signed)
 Cardiology Consultation:    Date:  09/30/2024   ID:  Valerie Walker, DOB 06-Jul-1971, MRN [id number]  PCP:  Frann Mabel Mt, DO  Cardiologist:  Alean SAUNDERS Rhylen Pulido, MD   Referring MD: Silver Wonda LABOR, PA   No chief complaint on file.    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  Has been off anticoagulants after that.  No further recurrence of PE.   Problem List Items Addressed This Visit     Hypertension   Reports usually well-controlled. Slightly suboptimal today. Target blood pressure 130/80 mmHg. Continue olmesartan  20 mg once daily.      Palpitations - Primary   Appears related with isolated PACs and PVCs on Kardia mobile tracings reviewed on her phone. Reassured her. EKG otherwise without any major abnormalities.  Will obtain Zio patch for 14 days.  Reviewed potential for ectopic beats being triggered by dehydration, stress, lack of sleep, stimulants such as coffee and tea, caffeinated sodas.  Will evaluate cardiac structure and function with an echocardiogram.      Relevant Orders   EKG 12-Lead (Completed)   Exercise Tolerance Test   ECHOCARDIOGRAM COMPLETE   Comprehensive metabolic panel with GFR   Lipid panel   LONG TERM MONITOR (3-14 DAYS)   Coronary atherosclerosis   Low calcium score of 1 back in December 2023. Which is 84th percentile for her age at the time.  No recent lipid panel since November 2023.  Given her age and minimal coronary atherosclerosis would recommend target LDL levels below 70 mg/dL.  Currently no indication for aspirin and also given her  symptoms of reflux epigastric discomfort likely related to GERD, hold off on aspirin.  Will obtain lipid panel fasting. If LDL levels not at target would recommend statins such as Crestor 5 mg once daily.  She is agreeable.      Exercise intolerance   Reports exercise intolerance with lightheadedness after a treadmill activities.  Likely related to deconditioning as she does not routinely exercise.  Will obtain objective assessment with treadmill EKG stress test.       Hiatal hernia   Small hiatal hernia noted on prior imaging studies, now with excess symptoms of reflux, ongoing epigastric discomfort likely result of gastroesophageal reflux disease.  If symptoms are acutely worsening would recommend further evaluation emergently in the ER.  She is going to reach out to gastroenterology team to expedite her visit if possible.  Recommended to trial over-the-counter antacid such or proton pump inhibitor such as Prilosec. Recommended to reach out to her PCP.        Follow-up in the office tentatively in 6 weeks.   History of Present Illness:    Valerie Walker is a 53 y.o. female who is being seen today for the evaluation of palpitations, atypical chest discomfort at the request of Silver Wonda LABOR, [place]. PCP is Dr. Frann.  Pleasant woman here for the visit by herself.  Is a engineer, manufacturing.  Lives at home with her husband was a paediatric nurse in [place].  Does not exercise routinely but stays active physically intermittently.  Noticed getting very fatigued  after treadmill exercise recently.  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  Has been off anticoagulants after that.  No further recurrence of PE.  For the last couple months she has been dealing with symptoms of atypical epigastric discomfort and reflux.  She had symptoms of COVID/upper respiratory infection in August and September.  Recently had a visit to the ER on 08/20/2024 for symptoms of chest discomfort associated with palpitations and Kardia mobile at home identifying PACs and PVCs [reviewed tracings on her phone myself].  Evaluation with high-sensitivity troponin, D-dimer, TSH panel were unremarkable was referred to cardiology.  Had another visit to the ER 09/12/2024 for symptoms of left-sided chest pain for couple days associated with sensitive area to touch on the left side, pain described as a bandlike sensation and felt to be more musculoskeletal in nature in the ER.  High-sensitivity troponin and D-dimer were unremarkable.  Subsequently had follow-up visit with her PCP 09/14/2024 by which time she developed a rash over the left T3 dermatome consistent with herpes zoster and was prescribed Valtrex.  She continues to have symptoms of epigastric discomfort associated with reflux.  Currently pending gastroenterology evaluation appointment in December.  Has been significantly uncomfortable with this associated with nausea.  Has been using Tums on an as-needed basis.  Denies any chest pressure, shortness of breath, orthopnea, paroxysmal nocturnal dyspnea.  Does have symptoms of palpitations which she describes as flutter like sensation brief episodes.  No syncopal or near syncopal episodes. No pedal edema.  Good blood pressure control somewhat elevated above optimal levels recently with her ongoing symptoms.  Does not smoke. Does not drink alcohol. No illicit drug use. Drinks sweet tea occasionally.  EKG in the clinic today shows sinus rhythm heart rate 79/min, PR interval 140 ms, QRS duration 86 ms, nonspecific ST-T changes in inferior  leads.  Last lipid panel reviewed from 10/15/2022 with total cholesterol 220, HDL 66, LDL 138, triglycerides 75.  Past Medical History:  Diagnosis Date   Acute non-recurrent frontal sinusitis 12/13/2015   Last Assessment & Plan:    Recurring sinusitis.   Chronic recurrent for years but worse this past year.  Right periorbital pain acutely.  See HPI.   EXAM shows no polyps or pus.  Left septal deformity.    PLAN Tryptan when symptomatic.  Discussed might be migraine.  CT if no better.     Allergy    Asthma    Diverticulitis    History of blood transfusion    Had after delivery of baby   History of pulmonary embolus (PE) 09/01/2018   Hx of blood clots 2002   unprovoked   Hypertension    Liver lesion 09/01/2018   Low grade fever 12/13/2015   Tear of right acetabular labrum 02/16/2022   Thyroid  disease     History reviewed. No pertinent surgical history.  Current Medications: Current Meds  Medication Sig   amitriptyline (ELAVIL) 10 MG tablet Take 1 tablet (10 mg total) by mouth at bedtime.   mometasone  (NASONEX ) 50 MCG/ACT nasal spray Place 2 sprays into the nose daily.   olmesartan  (BENICAR ) 20 MG tablet TAKE 1 TABLET BY MOUTH EVERY DAY   ondansetron (ZOFRAN-ODT) 4  MG disintegrating tablet Take 1 tablet (4 mg total) by mouth every 8 (eight) hours as needed.     Allergies:   Doxycycline , Erythromycin, Levaquin [levofloxacin in d5w], Penicillins, Bactrim  [sulfamethoxazole -trimethoprim ], Flagyl  [metronidazole ], Aspirin, and Erythromycin base   Social History   Socioeconomic History   Marital status: Married    Spouse name: Not on file   Number of children: Not on file   Years of education: Not on file   Highest education level: Associate degree: academic program  Occupational History   Not on file  Tobacco Use   Smoking status: Never   Smokeless tobacco: Never  Substance and Sexual Activity   Alcohol use: Never   Drug use: Never   Sexual activity: Not on file  Other  Topics Concern   Not on file  Social History Narrative   Not on file   Social Drivers of Health   Financial Resource Strain: Low Risk  (09/14/2024)   Overall Financial Resource Strain (CARDIA)    Difficulty of Paying Living Expenses: Not hard at all  Food Insecurity: No Food Insecurity (09/14/2024)   Hunger Vital Sign    Worried About Running Out of Food in the Last Year: Never true    Ran Out of Food in the Last Year: Never true  Transportation Needs: No Transportation Needs (09/14/2024)   PRAPARE - Administrator, Civil Service (Medical): No    Lack of Transportation (Non-Medical): No  Physical Activity: Insufficiently Active (09/14/2024)   Exercise Vital Sign    Days of Exercise per Week: 2 days    Minutes of Exercise per Session: 10 min  Stress: No Stress Concern Present (09/14/2024)   [employer] of Occupational Health - Occupational Stress Questionnaire    Feeling of Stress: Not at all  Social Connections: Socially Integrated (09/14/2024)   Social Connection and Isolation Panel    Frequency of Communication with Friends and Family: More than three times a week    Frequency of Social Gatherings with Friends and Family: More than three times a week    Attends Religious Services: More than 4 times per year    Active Member of [employer] or Organizations: Yes    Attends Engineer, Structural: More than 4 times per year    Marital Status: Married     Family History: The patient's family history includes Alcohol abuse in her paternal grandfather; Asthma in her maternal grandmother, mother, and son; Cancer in her father; Early death in her paternal grandfather; Heart attack in her maternal grandfather and paternal grandmother; Heart disease in her maternal grandfather and paternal grandmother; Hypertension in her brother, father, and mother; Kidney disease in her mother. ROS:   Please see the history of present illness.    All 14 point review of systems  negative except as described per history of present illness.  EKGs/Labs/Other Studies Reviewed:    The following studies were reviewed today:   EKG:  EKG Interpretation Date/Time:  Wednesday September 30 2024 09:19:29 EST Ventricular Rate:  79 PR Interval:  140 QRS Duration:  86 QT Interval:  382 QTC Calculation: 438 R Axis:   73  Text Interpretation: Normal sinus rhythm Nonspecific ST abnormality When compared with ECG of 12-Sep-2024 07:26, PREVIOUS ECG IS PRESENT Confirmed by Liborio Hai reddy [phone]) on 09/30/2024 9:26:31 AM    Recent Labs: 08/20/2024: TSH 0.587 09/12/2024: ALT 14; BUN 12; Creatinine, Ser 0.84; Potassium 3.5; Sodium 139 09/14/2024: Hemoglobin 13.4; Platelets 378.0  Recent Lipid Panel  Component Value Date/Time   CHOL 220 (H) 10/15/2022 1150   TRIG 75.0 10/15/2022 1150   HDL 66.90 10/15/2022 1150   CHOLHDL 3 10/15/2022 1150   VLDL 15.0 10/15/2022 1150   LDLCALC 138 (H) 10/15/2022 1150    Physical Exam:    VS:  BP 134/82   Pulse 79   Ht 5' 4 (1.626 m)   Wt 139 lb 1.9 oz (63.1 kg)   SpO2 99%   BMI 23.88 kg/m     Wt Readings from Last 3 Encounters:  09/30/24 139 lb 1.9 oz (63.1 kg)  09/14/24 146 lb (66.2 kg)  08/31/24 149 lb 9.6 oz (67.9 kg)     GENERAL:  Well nourished, well developed in no acute distress NECK: No JVD; No carotid bruits CARDIAC: RRR, S1 and S2 present, no murmurs, no rubs, no gallops CHEST:  Clear to auscultation without rales, wheezing or rhonchi  Extremities: No pitting pedal edema. Pulses bilaterally symmetric with radial 2+ and dorsalis pedis 2+ NEUROLOGIC:  Alert and oriented x 3  Medication Adjustments/Labs and Tests Ordered: Current medicines are reviewed at length with the patient today.  Concerns regarding medicines are outlined above.  Orders Placed This Encounter  Procedures   Comprehensive metabolic panel with GFR   Lipid panel   Exercise Tolerance Test   LONG TERM MONITOR (3-14 DAYS)   EKG 12-Lead    ECHOCARDIOGRAM COMPLETE   No orders of the defined types were placed in this encounter.   Signed, Deeanne Deininger reddy Anina Schnake, MD, MPH, [facility]. 09/30/2024 9:55 AM    [facility]

## 2024-09-30 NOTE — Assessment & Plan Note (Signed)
 Low calcium score of 1 back in December 2023. Which is 84th percentile for her age at the time.  No recent lipid panel since November 2023.  Given her age and minimal coronary atherosclerosis would recommend target LDL levels below 70 mg/dL.  Currently no indication for aspirin and also given her symptoms of reflux epigastric discomfort likely related to GERD, hold off on aspirin.  Will obtain lipid panel fasting. If LDL levels not at target would recommend statins such as Crestor 5 mg once daily.  She is agreeable.

## 2024-10-01 ENCOUNTER — Other Ambulatory Visit: Payer: Self-pay

## 2024-10-01 DIAGNOSIS — R002 Palpitations: Secondary | ICD-10-CM

## 2024-10-01 DIAGNOSIS — I2510 Atherosclerotic heart disease of native coronary artery without angina pectoris: Secondary | ICD-10-CM

## 2024-10-01 DIAGNOSIS — K449 Diaphragmatic hernia without obstruction or gangrene: Secondary | ICD-10-CM

## 2024-10-01 DIAGNOSIS — I10 Essential (primary) hypertension: Secondary | ICD-10-CM

## 2024-10-01 DIAGNOSIS — R6889 Other general symptoms and signs: Secondary | ICD-10-CM

## 2024-10-02 ENCOUNTER — Telehealth: Payer: Self-pay

## 2024-10-02 ENCOUNTER — Emergency Department (HOSPITAL_BASED_OUTPATIENT_CLINIC_OR_DEPARTMENT_OTHER)
Admission: EM | Admit: 2024-10-02 | Discharge: 2024-10-02 | Disposition: A | Discharge status: Home / Self Care | Attending: Emergency Medicine | Admitting: Emergency Medicine

## 2024-10-02 ENCOUNTER — Encounter (HOSPITAL_BASED_OUTPATIENT_CLINIC_OR_DEPARTMENT_OTHER): Payer: Self-pay

## 2024-10-02 ENCOUNTER — Other Ambulatory Visit: Payer: Self-pay

## 2024-10-02 ENCOUNTER — Ambulatory Visit: Payer: Self-pay

## 2024-10-02 DIAGNOSIS — R110 Nausea: Secondary | ICD-10-CM | POA: Diagnosis present

## 2024-10-02 DIAGNOSIS — R799 Abnormal finding of blood chemistry, unspecified: Secondary | ICD-10-CM

## 2024-10-02 DIAGNOSIS — R7989 Other specified abnormal findings of blood chemistry: Secondary | ICD-10-CM | POA: Insufficient documentation

## 2024-10-02 HISTORY — DX: Abnormal finding of blood chemistry, unspecified: R79.9

## 2024-10-02 LAB — URINALYSIS, W/ REFLEX TO CULTURE (INFECTION SUSPECTED)
Bilirubin Urine: NEGATIVE
Glucose, UA: NEGATIVE mg/dL
Ketones, ur: NEGATIVE mg/dL
Nitrite: NEGATIVE
Protein, ur: NEGATIVE mg/dL
Specific Gravity, Urine: 1.01 (ref 1.005–1.030)
pH: 7 (ref 5.0–8.0)

## 2024-10-02 LAB — COMPREHENSIVE METABOLIC PANEL WITH GFR
ALT: 12 IU/L (ref 0–32)
ALT: 11 U/L (ref 0–44)
AST: 13 IU/L (ref 0–40)
AST: 18 U/L (ref 15–41)
Albumin: 4.7 g/dL (ref 3.8–4.9)
Albumin: 4.6 g/dL (ref 3.5–5.0)
Alkaline Phosphatase: 102 IU/L (ref 49–135)
Alkaline Phosphatase: 98 U/L (ref 38–126)
Anion gap: 13 (ref 5–15)
BUN/Creatinine Ratio: 10 (ref 9–23)
BUN: 10 mg/dL (ref 6–24)
BUN: 7 mg/dL (ref 6–20)
Bilirubin Total: 0.6 mg/dL (ref 0.0–1.2)
CO2: 24 mmol/L (ref 20–29)
CO2: 25 mmol/L (ref 22–32)
Calcium: 10.7 mg/dL — ABNORMAL HIGH (ref 8.7–10.2)
Calcium: 9.7 mg/dL (ref 8.9–10.3)
Chloride: 99 mmol/L (ref 96–106)
Chloride: 100 mmol/L (ref 98–111)
Creatinine, Ser: 0.97 mg/dL (ref 0.57–1.00)
Creatinine, Ser: 0.79 mg/dL (ref 0.44–1.00)
GFR, Estimated: >60 mL/min (ref >60)
Globulin, Total: 2.9 g/dL (ref 1.5–4.5)
Glucose, Bld: 106 mg/dL — ABNORMAL HIGH (ref 70–99)
Glucose: 94 mg/dL (ref 70–99)
Potassium: 5.8 mmol/L (ref 3.5–5.2)
Potassium: 3.5 mmol/L (ref 3.5–5.1)
Sodium: 138 mmol/L (ref 134–144)
Sodium: 138 mmol/L (ref 135–145)
Total Bilirubin: 0.6 mg/dL (ref 0.0–1.2)
Total Protein: 7.6 g/dL (ref 6.0–8.5)
Total Protein: 7.8 g/dL (ref 6.5–8.1)
eGFR: 70 mL/min/1.73 (ref >59)

## 2024-10-02 LAB — CBC WITH DIFFERENTIAL/PLATELET
Abs Immature Granulocytes: 0.02 K/uL (ref 0.00–0.07)
Basophils Absolute: 0.1 K/uL (ref 0.0–0.1)
Basophils Relative: 1 %
Eosinophils Absolute: 0.1 K/uL (ref 0.0–0.5)
Eosinophils Relative: 1 %
HCT: 42.2 % (ref 36.0–46.0)
Hemoglobin: 13.9 g/dL (ref 12.0–15.0)
Immature Granulocytes: 0 %
Lymphocytes Relative: 24 %
Lymphs Abs: 2.2 K/uL (ref 0.7–4.0)
MCH: 28.9 pg (ref 26.0–34.0)
MCHC: 32.9 g/dL (ref 30.0–36.0)
MCV: 87.7 fL (ref 80.0–100.0)
Monocytes Absolute: 0.5 K/uL (ref 0.1–1.0)
Monocytes Relative: 5 %
Neutro Abs: 6.4 K/uL (ref 1.7–7.7)
Neutrophils Relative %: 69 %
Platelets: 468 K/uL — ABNORMAL HIGH (ref 150–400)
RBC: 4.81 MIL/uL (ref 3.87–5.11)
RDW: 12.7 % (ref 11.5–15.5)
WBC: 9.3 K/uL (ref 4.0–10.5)
nRBC: 0 % (ref 0.0–0.2)

## 2024-10-02 LAB — LIPID PANEL
Chol/HDL Ratio: 3.2 ratio (ref 0.0–4.4)
Cholesterol, Total: 198 mg/dL (ref 100–199)
HDL: 62 mg/dL (ref >39)
LDL Chol Calc (NIH): 125 mg/dL — ABNORMAL HIGH (ref 0–99)
Triglycerides: 58 mg/dL (ref 0–149)
VLDL Cholesterol Cal: 11 mg/dL (ref 5–40)

## 2024-10-02 LAB — MAGNESIUM: Magnesium: 2.2 mg/dL (ref 1.7–2.4)

## 2024-10-02 MED ORDER — SODIUM CHLORIDE 0.9 % IV BOLUS
1000.0000 mL | Freq: Once | INTRAVENOUS | Status: AC
  Administered 2024-10-02: 1000 mL via INTRAVENOUS

## 2024-10-02 NOTE — ED Triage Notes (Signed)
 Sent by PCP for hyperkalemia. Denies chest pain, SHOB, [place]

## 2024-10-02 NOTE — ED Provider Notes (Signed)
 [facility] Provider Note   CSN: [id number] Arrival date & time: 10/02/24  0932     Patient presents with: abnormal labs   JINNIE ONLEY is a 53 y.o. female.   The history is provided by the patient, the spouse and medical records. No language interpreter was used.  Illness Location:  Elevated potassium at pcp Severity:  Moderate Onset quality:  Unable to specify Timing:  Unable to specify Progression:  Unable to specify Chronicity:  New Associated symptoms: nausea   Associated symptoms: no abdominal pain, no chest pain, no congestion, no cough, no diarrhea, no fatigue, no fever, no headaches, no loss of consciousness, no rash, no shortness of breath, no vomiting and no wheezing        Prior to Admission medications   Medication Sig Start Date End Date Taking? Authorizing Provider  amitriptyline (ELAVIL) 10 MG tablet Take 1 tablet (10 mg total) by mouth at bedtime. 09/14/24   Frann Mabel Mt, DO  mometasone  (NASONEX ) 50 MCG/ACT nasal spray Place 2 sprays into the nose daily. 08/31/24   Frann Mabel Mt, DO  olmesartan  (BENICAR ) 20 MG tablet TAKE 1 TABLET BY MOUTH EVERY DAY 09/28/24   Wendling, Mabel Mt, DO  ondansetron (ZOFRAN-ODT) 4 MG disintegrating tablet Take 1 tablet (4 mg total) by mouth every 8 (eight) hours as needed. 09/12/24   Long, Fonda MATSU, MD    Allergies: Doxycycline , Erythromycin, Levaquin [levofloxacin in d5w], Penicillins, Bactrim  [sulfamethoxazole -trimethoprim ], Flagyl  [metronidazole ], Aspirin, and Erythromycin base    Review of Systems  Constitutional:  Negative for chills, fatigue and fever.  HENT:  Negative for congestion.   Respiratory:  Negative for cough, chest tightness, shortness of breath and wheezing.   Cardiovascular:  Negative for chest pain and palpitations (recent but not today).  Gastrointestinal:  Positive for nausea. Negative for abdominal pain, constipation, diarrhea and  vomiting.  Genitourinary:  Negative for dysuria and frequency.  Musculoskeletal:  Negative for back pain, neck pain and neck stiffness.  Skin:  Negative for rash and wound.  Neurological:  Negative for dizziness, tremors, loss of consciousness, speech difficulty, weakness, light-headedness, numbness and headaches.  Psychiatric/Behavioral:  Negative for agitation and confusion.   All other systems reviewed and are negative.   Updated Vital Signs BP 138/88   Pulse 82   Temp 98 F (36.7 C)   Resp 17   SpO2 100%   Physical Exam Vitals and nursing note reviewed.  Constitutional:      General: She is not in acute distress.    Appearance: She is well-developed. She is not ill-appearing, toxic-appearing or diaphoretic.  HENT:     Head: Normocephalic and atraumatic.     Nose: No congestion or rhinorrhea.     Mouth/Throat:     Mouth: Mucous membranes are dry.     Pharynx: No oropharyngeal exudate or posterior oropharyngeal erythema.  Eyes:     Extraocular Movements: Extraocular movements intact.     Conjunctiva/sclera: Conjunctivae normal.     Pupils: Pupils are equal, round, and reactive to light.  Cardiovascular:     Rate and Rhythm: Normal rate and regular rhythm.     Pulses: Normal pulses.     Heart sounds: No murmur heard. Pulmonary:     Effort: Pulmonary effort is normal. No respiratory distress.     Breath sounds: Normal breath sounds. No wheezing, rhonchi or rales.  Chest:     Chest wall: No tenderness.     Comments: Monitoring  cardiac patch in place on left chest. Abdominal:     General: Abdomen is flat.     Palpations: Abdomen is soft.     Tenderness: There is no abdominal tenderness. There is no guarding or rebound.  Musculoskeletal:        General: No swelling or tenderness.     Cervical back: Neck supple. No tenderness.  Skin:    General: Skin is warm and dry.     Capillary Refill: Capillary refill takes less than 2 seconds.     Findings: No erythema.   Neurological:     General: No focal deficit present.     Mental Status: She is alert.  Psychiatric:        Mood and Affect: Mood normal.     (all labs ordered are listed, but only abnormal results are displayed) Labs Reviewed  CBC WITH DIFFERENTIAL/PLATELET - Abnormal; Notable for the following components:      Result Value   Platelets 468 (*)    All other components within normal limits  COMPREHENSIVE METABOLIC PANEL WITH GFR - Abnormal; Notable for the following components:   Glucose, Bld 106 (*)    All other components within normal limits  URINALYSIS, W/ REFLEX TO CULTURE (INFECTION SUSPECTED) - Abnormal; Notable for the following components:   Hgb urine dipstick TRACE (*)    Leukocytes,Ua SMALL (*)    Bacteria, UA FEW (*)    All other components within normal limits  MAGNESIUM    EKG: EKG Interpretation Date/Time:  Friday October 02 2024 09:42:15 EST Ventricular Rate:  69 PR Interval:  147 QRS Duration:  88 QT Interval:  389 QTC Calculation: 417 R Axis:   72  Text Interpretation: Sinus rhythm when compared top rior, similar appearance No STEMI Confirmed by Ginger Barefoot ([id number]) on 10/02/2024 9:44:11 AM  Radiology: No results found.   Procedures   Medications Ordered in the ED  sodium chloride 0.9 % bolus 1,000 mL (1,000 mLs Intravenous New Bag/Given 10/02/24 1009)                                    Medical Decision Making Amount and/or Complexity of Data Reviewed Labs: ordered.    DELLENE MCGROARTY is a 53 y.o. female with a past medical history significant for previous pulmonary embolism, previous diverticulitis, hiatal hernia, asthma, thyroid  disease, and hypertension who presents from PCP to get blood work for possible hyperkalemia.  According to patient, she has had some nausea recently and had vomiting before with hiatal hernia but has been taking medication and is doing much better.  She reports she is drinking a large amount of Gatorade to try to  help with her electrolytes and yesterday she saw her PCP and had blood work and was told her potassium was elevated.  She was told to come here emergently to get reevaluated although patient is asymptomatic.  She reports that she was given a cardiac monitor patch onto her chest several days ago because she has been having recent palpitations but reports the last few days she has not been having them.  Otherwise today she denies palpitations, chest pain, shortness of breath, lightheadedness, vomiting, constipation, diarrhea, or urinary changes.  She does report occasional nausea but is not having it now.    She says she is feeling well and her vital signs are reassuring on arrival.  On my exam, lungs clear.  Chest nontender.  She does have her monitor in place on her left chest.  No murmur.  Good pulses in extremities.  No focal neurologic deficits.  She does have dry mucous membranes and tells me she feels somewhat dehydrated.  Exam otherwise unremarkable.  Good pulses.  We agreed to get some labs to either refute or confirm per kalemia but we will give her some IV fluids while we are waiting.  Anticipate reassessment after her labs to determine disposition.  10:33 AM Patient's labs returned reassuring.  Potassium is normal today at 3.5.  Her platelets magnesium normal.  Ahave been elevated in the past and seems similar.  Patient confirms no urinary symptoms so do not suspect UTI given her lack of nitrites but a culture will be sent.  Patient still feels well and vital signs are reassuring.  Will let her finish her fluids and p.o. challenge but anticipate discharge home to follow-up with PCP.  Suspect either some mild hemolysis during collection versus transient elevated potassium.  Patient agrees with management plan and will plan for discharge after she finishes fluids.      Final diagnoses:  Abnormal blood chemistry test    ED Discharge Orders     None       Clinical Impression: 1.  Abnormal blood chemistry test     Disposition: Discharge  Condition: Good  I have discussed the results, Dx and Tx plan with the pt(& family if present). He/she/they expressed understanding and agree(s) with the plan. Discharge instructions discussed at great length. Strict return precautions discussed and pt &/or family have verbalized understanding of the instructions. No further questions at time of discharge.    New Prescriptions   No medications on file    Follow Up: Frann Mabel Mt, DO [address] [place] [address] [phone]     [facility] Emergency Department at [facility] [address] [place]  [address] [phone]       Chamberlain Steinborn, Lonni PARAS, MD 10/02/24 [phone]

## 2024-10-02 NOTE — Discharge Instructions (Signed)
 Your history, exam, and PCP documentation was concerning for elevated potassium due to your Gatorade intake and medications however our lab testing today confirmed a normal potassium and otherwise reassuring labs.  As you had no urinary symptoms, a urine culture will be sent as I am less suspicious for urinary tract infection at this time.  Please rest and stay hydrated and follow-up with your primary doctor.  If any symptoms develop or worsen, please return to the nearest emergency department.

## 2024-10-02 NOTE — Telephone Encounter (Signed)
 [facility] calling to report critical results

## 2024-10-02 NOTE — Telephone Encounter (Signed)
 Pt returning call with questions about mychart results. Please advise.

## 2024-10-03 LAB — URINE CULTURE: Culture: >=100000 — AB

## 2024-10-04 ENCOUNTER — Telehealth (HOSPITAL_BASED_OUTPATIENT_CLINIC_OR_DEPARTMENT_OTHER): Payer: Self-pay | Admitting: *Deleted

## 2024-10-04 NOTE — Telephone Encounter (Signed)
 Post ED Visit - Positive Culture Follow-up  Culture report reviewed by antimicrobial stewardship pharmacist: [facility] []  Rankin Dee, Pharm.D. []  Venetia Gully, Pharm.D., BCPS AQ-ID []  Garrel Crews, Pharm.D., BCPS []  Almarie Lunger, Pharm.D., BCPS []  [place], [address].D., BCPS, AAHIVP []  Rosaline Bihari, Pharm.D., BCPS, AAHIVP []  Vernell Meier, PharmD, BCPS []  Latanya Hint, PharmD, BCPS []  Donald Medley, PharmD, BCPS []  Rocky Bold, PharmD []  Dorothyann Alert, PharmD, BCPS []  Morene Babe, PharmD  [facility] []  Rosaline Edison, PharmD []  Romona Bliss, PharmD []  Dolphus Roller, PharmD []  Veva Seip, Rph []  Vernell Daunt) Leonce, PharmD []  Eva Allis, PharmD []  Rosaline Millet, PharmD []  Iantha Batch, PharmD []  Arvin Gauss, PharmD []  Wanda Hasting, PharmD []  Ronal Rav, PharmD []  Rocky Slade, PharmD []  Bard Jeans, PharmD   Positive urine culture reviewed by Dorn Buttner Patient without symptoms No treatment provided  and no further patient follow-up is required at this time.  Jama Wyman Kipper 10/04/2024, 2:43 PM

## 2024-10-08 ENCOUNTER — Ambulatory Visit

## 2024-10-12 ENCOUNTER — Encounter: Payer: Self-pay | Admitting: Family Medicine

## 2024-10-13 ENCOUNTER — Ambulatory Visit: Admitting: Family Medicine

## 2024-10-20 ENCOUNTER — Ambulatory Visit (HOSPITAL_BASED_OUTPATIENT_CLINIC_OR_DEPARTMENT_OTHER)
Admission: RE | Admit: 2024-10-20 | Discharge: 2024-10-20 | Disposition: A | Discharge status: Home / Self Care | Source: Ambulatory Visit

## 2024-10-20 DIAGNOSIS — R002 Palpitations: Secondary | ICD-10-CM | POA: Diagnosis present

## 2024-10-20 DIAGNOSIS — I2510 Atherosclerotic heart disease of native coronary artery without angina pectoris: Secondary | ICD-10-CM | POA: Diagnosis not present

## 2024-10-20 LAB — ECHOCARDIOGRAM COMPLETE
AR max vel: 2.21 cm2
AV Area VTI: 2.26 cm2
AV Area mean vel: 2.17 cm2
AV Mean grad: 3 mmHg
AV Peak grad: 5 mmHg
Ao pk vel: 1.12 m/s
Area-P 1/2: 3.83 cm2
Calc EF: 67.9 %
MV M vel: 4.05 m/s
MV Peak grad: 65.6 mmHg
S' Lateral: 2.3 cm
Single Plane A2C EF: 68.3 %
Single Plane A4C EF: 67.9 %

## 2024-10-28 ENCOUNTER — Ambulatory Visit: Payer: Self-pay

## 2024-10-28 DIAGNOSIS — R002 Palpitations: Secondary | ICD-10-CM

## 2024-10-29 ENCOUNTER — Telehealth (HOSPITAL_COMMUNITY): Payer: Self-pay | Admitting: *Deleted

## 2024-10-29 NOTE — Telephone Encounter (Signed)
 Left detailed instruction for ETT on vm.

## 2024-10-30 ENCOUNTER — Ambulatory Visit: Admitting: Physician Assistant

## 2024-10-30 NOTE — Progress Notes (Deleted)
 10/30/2024 Valerie Walker [id number] 03-Nov-1971  Referring provider: Frann Mabel Mt* Primary GI doctor: {[id number]}  ASSESSMENT AND PLAN:    Patient Care Team: Frann Mabel Mt, DO as PCP - General (Family Medicine)  HISTORY OF PRESENT ILLNESS: 53 y.o. female with a past medical history listed below presents for evaluation of ***.   *** Discussed the use of AI scribe software for clinical note transcription with the patient, who gave verbal consent to proceed.  History of Present Illness            She  reports that she has never smoked. She has never used smokeless tobacco. She reports that she does not drink alcohol and does not use drugs.  RELEVANT GI HISTORY, IMAGING AND LABS: Results          CBC    Component Value Date/Time   WBC 9.3 10/02/2024 1004   RBC 4.81 10/02/2024 1004   HGB 13.9 10/02/2024 1004   HCT 42.2 10/02/2024 1004   PLT 468 (H) 10/02/2024 1004   MCV 87.7 10/02/2024 1004   MCH 28.9 10/02/2024 1004   MCHC 32.9 10/02/2024 1004   RDW 12.7 10/02/2024 1004   LYMPHSABS 2.2 10/02/2024 1004   MONOABS 0.5 10/02/2024 1004   EOSABS 0.1 10/02/2024 1004   BASOSABS 0.1 10/02/2024 1004   Recent Labs    04/18/24 0857 04/18/24 0859 08/20/24 1218 09/12/24 0725 09/14/24 1136 10/02/24 1004  HGB 12.9 14.3 13.9 14.1 13.4 13.9    CMP     Component Value Date/Time   NA 138 10/02/2024 1004   NA 138 10/01/2024 0914   K 3.5 10/02/2024 1004   CL 100 10/02/2024 1004   CO2 25 10/02/2024 1004   GLUCOSE 106 (H) 10/02/2024 1004   BUN 7 10/02/2024 1004   BUN 10 10/01/2024 0914   CREATININE 0.79 10/02/2024 1004   CALCIUM 9.7 10/02/2024 1004   PROT 7.8 10/02/2024 1004   PROT 7.6 10/01/2024 0914   ALBUMIN 4.6 10/02/2024 1004   ALBUMIN 4.7 10/01/2024 0914   AST 18 10/02/2024 1004   ALT 11 10/02/2024 1004   ALKPHOS 98 10/02/2024 1004   BILITOT 0.6 10/02/2024 1004   BILITOT 0.6 10/01/2024 0914   GFRNONAA >60 10/02/2024 1004    GFRAA 113 10/24/2020 0000      Latest Ref Rng & Units 10/02/2024   10:04 AM 10/01/2024    9:14 AM 09/12/2024    7:25 AM  Hepatic Function  Total Protein 6.5 - 8.1 g/dL 7.8  7.6  8.1   Albumin 3.5 - 5.0 g/dL 4.6  4.7  4.8   AST 15 - 41 U/L 18  13  21    ALT 0 - 44 U/L 11  12  14    Alk Phosphatase 38 - 126 U/L 98  102  122   Total Bilirubin 0.0 - 1.2 mg/dL 0.6  0.6  0.6   Bilirubin, Direct 0.0 - 0.2 mg/dL   0.2       Current Medications:    Current Outpatient Medications (Cardiovascular):    olmesartan  (BENICAR ) 20 MG tablet, TAKE 1 TABLET BY MOUTH EVERY DAY  Current Outpatient Medications (Respiratory):    mometasone  (NASONEX ) 50 MCG/ACT nasal spray, Place 2 sprays into the nose daily.    Current Outpatient Medications (Other):    amitriptyline  (ELAVIL ) 10 MG tablet, Take 1 tablet (10 mg total) by mouth at bedtime.   ondansetron (ZOFRAN-ODT) 4 MG disintegrating tablet, Take 1 tablet (4 mg total) by  mouth every 8 (eight) hours as needed.  Medical History:  Past Medical History:  Diagnosis Date   Acute non-recurrent frontal sinusitis 12/13/2015   Last Assessment & Plan:    Recurring sinusitis.   Chronic recurrent for years but worse this past year.  Right periorbital pain acutely.  See HPI.   EXAM shows no polyps or pus.  Left septal deformity.    PLAN Tryptan when symptomatic.  Discussed might be migraine.  CT if no better.     Allergy    Asthma    Diverticulitis    History of blood transfusion    Had after delivery of baby   History of pulmonary embolus (PE) 09/01/2018   Hx of blood clots 2002   unprovoked   Hypertension    Liver lesion 09/01/2018   Low grade fever 12/13/2015   Tear of right acetabular labrum 02/16/2022   Thyroid  disease    Allergies:  Allergies  Allergen Reactions   Doxycycline  Palpitations    Other reaction(s): GI Intolerance  Nausea/vomiting  Other Reaction(s): GI intolerance  Nausea/vomiting    Other reaction(s): GI Intolerance  Nausea/vomiting   Erythromycin Hives and Swelling    Severe rash and hives   Levaquin [Levofloxacin In D5w] Palpitations    palpitations and headaches   Penicillins Anaphylaxis    Tolerated Keflex OK   Bactrim  [Sulfamethoxazole -Trimethoprim ] Hives   Flagyl  [Metronidazole ] Hives   Aspirin Rash, Dermatitis and Swelling    Other Reaction(s): GI intolerance   Erythromycin Base Dermatitis and Nausea Only     Surgical History:  She  has no past surgical history on file. Family History:  Her family history includes Alcohol abuse in her paternal grandfather; Asthma in her maternal grandmother, mother, and son; Cancer in her father; Early death in her paternal grandfather; Heart attack in her maternal grandfather and paternal grandmother; Heart disease in her maternal grandfather and paternal grandmother; Hypertension in her brother, father, and mother; Kidney disease in her mother.  REVIEW OF SYSTEMS  : All other systems reviewed and negative except where noted in the History of Present Illness.  PHYSICAL EXAM: There were no vitals taken for this visit. Physical Exam          Alan JONELLE Coombs, PA-C 12:33 PM

## 2024-11-04 ENCOUNTER — Other Ambulatory Visit: Payer: Self-pay | Admitting: Family Medicine

## 2024-11-04 DIAGNOSIS — I10 Essential (primary) hypertension: Secondary | ICD-10-CM

## 2024-11-04 NOTE — Addendum Note (Signed)
 Addended by: LIBORIO HAI REDDY on: 11/04/2024 08:37 AM   Modules accepted: Orders

## 2024-11-05 ENCOUNTER — Ambulatory Visit (HOSPITAL_COMMUNITY): Admission: RE | Admit: 2024-11-05 | Source: Ambulatory Visit

## 2024-11-11 ENCOUNTER — Other Ambulatory Visit: Payer: Self-pay | Admitting: Family Medicine

## 2024-11-11 DIAGNOSIS — B029 Zoster without complications: Secondary | ICD-10-CM

## 2024-11-12 ENCOUNTER — Telehealth (HOSPITAL_COMMUNITY): Payer: Self-pay

## 2024-11-12 NOTE — Telephone Encounter (Signed)
 Patient cancelled GXT. I have called patient to reschedule and have left Vn's an patient has not called back. We will be removing the order form the active WQ. If patient calls back we will reinstate the order and schedule. Thank you.   11/12/24 [facility] @ 8:59 will removed from the WQ /LBW  11/05/24 [facility] @ 8:15/LBW  11/04/24 pt is sick and not able to come.

## 2024-12-06 ENCOUNTER — Other Ambulatory Visit: Payer: Self-pay | Admitting: Family Medicine

## 2024-12-06 DIAGNOSIS — I10 Essential (primary) hypertension: Secondary | ICD-10-CM

## 2024-12-09 ENCOUNTER — Ambulatory Visit

## 2024-12-10 ENCOUNTER — Ambulatory Visit: Admitting: Gastroenterology
